# Patient Record
Sex: Female | Born: 1990 | Race: Black or African American | Hispanic: No | Marital: Single | Smoking: Never smoker
Health system: Southern US, Community
[De-identification: ages and names within clinical notes are randomized; demographics above are authoritative.]

---
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
## 2011-12-29 DIAGNOSIS — B3731 Acute candidiasis of vulva and vagina: Secondary | ICD-10-CM

## 2011-12-29 HISTORY — DX: Acute candidiasis of vulva and vagina: B37.31

## 2012-06-04 DIAGNOSIS — N390 Urinary tract infection, site not specified: Secondary | ICD-10-CM | POA: Insufficient documentation

## 2012-06-04 HISTORY — DX: Urinary tract infection, site not specified: N39.0

## 2012-06-06 DIAGNOSIS — N760 Acute vaginitis: Secondary | ICD-10-CM | POA: Insufficient documentation

## 2012-06-06 HISTORY — DX: Acute vaginitis: N76.0

## 2012-08-20 DIAGNOSIS — A5400 Gonococcal infection of lower genitourinary tract, unspecified: Secondary | ICD-10-CM | POA: Insufficient documentation

## 2012-08-20 DIAGNOSIS — A5901 Trichomonal vulvovaginitis: Secondary | ICD-10-CM | POA: Insufficient documentation

## 2012-08-20 DIAGNOSIS — N644 Mastodynia: Secondary | ICD-10-CM | POA: Insufficient documentation

## 2012-08-20 HISTORY — DX: Trichomonal vulvovaginitis: A59.01

## 2012-08-20 HISTORY — DX: Gonococcal infection of lower genitourinary tract, unspecified: A54.00

## 2012-08-20 HISTORY — DX: Mastodynia: N64.4

## 2016-01-14 ENCOUNTER — Emergency Department
Admission: EM | Admit: 2016-01-14 | Discharge: 2016-01-15 | Disposition: A | Discharge status: Home / Self Care | Payer: Self-pay | Attending: Emergency Medicine | Admitting: Emergency Medicine

## 2016-01-14 ENCOUNTER — Emergency Department: Payer: Self-pay

## 2016-01-14 DIAGNOSIS — N898 Other specified noninflammatory disorders of vagina: Secondary | ICD-10-CM | POA: Insufficient documentation

## 2016-01-14 LAB — URINE BHCG POC: Urine bHCG POC: NEGATIVE

## 2016-01-14 NOTE — ED Triage Notes (Signed)
Pt c/o intermittent lower abd pain x2 days. Also c/o green vaginal discharge, odor, and itching. Denies dysuria, hematuria, n/v/d, fever.

## 2016-01-14 NOTE — ED Provider Notes (Signed)
EMERGENCY DEPARTMENT HISTORY AND PHYSICAL EXAM     Physician/Midlevel provider first contact with patient: 01/14/16 2327         Date: 01/14/2016  Patient Name: Anita Huffman    History of Presenting Illness     Chief Complaint   Patient presents with   . Abdominal Pain   . Vaginal Discharge       History Provided By: Patient    Chief Complaint: Abdominal pain  Onset: 2 days ago  Timing: Constant  Location: Lower abdomen  Quality: Cramping pain  Severity: Moderate  Exacerbating factors: None  Alleviating factors: None  Associated Symptoms: Vaginal discharge    Additional History: Anita Huffman is a 25 y.o. female presenting to the ED with constant lower cramping abdominal pain starting 2 days ago. Pt states that she has not taken any medications for her symptoms. She also reports vaginal discharge starting earlier today. Pt's LNMP was 9/21, and she denies any history of pregnancy. Pt states that she is sexually active with men without birth control, but is not trying to get pregnant. She states that she has had chlamydia and gonorrhea in the past, and her current symptoms feel similar to her previous chlamydia and gonorrhea.     PCP: Pcp, Noneorunknown, MD  SPECIALISTS:    No current facility-administered medications for this encounter.      No current outpatient prescriptions on file.       Past History     Past Medical History:  History reviewed. No pertinent past medical history.    Past Surgical History:  History reviewed. No pertinent surgical history.    Family History:  No family history on file.    Social History:  Social History   Substance Use Topics   . Smoking status: Not on file   . Smokeless tobacco: Not on file   . Alcohol use Not on file       Allergies:  No Known Allergies    Review of Systems     Review of Systems   Constitutional: Negative for fever.   Gastrointestinal: Positive for abdominal pain ( mild lower abd pain/cramps). Negative for diarrhea, nausea and vomiting.   Genitourinary:  Negative for dysuria and flank pain.        Positive vaginal discharge   Musculoskeletal: Negative for back pain.   Neurological: Negative for loss of consciousness.   Psychiatric/Behavioral: Negative for suicidal ideas.       Physical Exam   BP 126/71   Pulse 74   Temp 97.5 F (36.4 C)   Resp 16   Ht 5\' 3"  (1.6 m)   Wt 54.4 kg   SpO2 100%   BMI 21.26 kg/m     CONSTITUTIONAL   Vital signs reviewed, Well appearing, Patient appears comfortable.  HEAD   Atraumatic. Normocephalic.  EYES   Eyes are normal to inspection, No discharge from eyes, Sclera are normal.  NECK   Normal ROM, No jugular venous distention, no tenderness, no meningeal signs.  CARDIOVASCULAR   RRR, No murmurs.  ABDOMEN   Abdomen is nontender, No distension, No peritoneal signs.  BACK   There is no CVA Tenderness, There is no tenderness to palpation.  NEURO   Alert and oriented, appropriate. No focal motor deficits. No focal sensory deficits. Speech normal.  SKIN   Skin is warm, Skin is dry, Skin Is normal color.  EXTREMITIES no c/c/e, no calf tenderness, negative homans  PELVIC Normal external genitalia. Copious green discharge.  No CMT, no adnexal tenderness.    Diagnostic Study Results     Labs -     Results     Procedure Component Value Units Date/Time    Wet Prep Trichomonas [[id number]] Collected:  01/14/16 2345    Specimen:  Vaginal Swab Updated:  01/15/16 0151    Narrative:       ORDER#: [id number]                                    ORDERED BY: Marlana Latus  SOURCE: Vaginal Swab                                 COLLECTED:  01/14/16 23:45  ANTIBIOTICS AT COLL.:                                RECEIVED :  01/15/16 01:44  Wet Prep Trichomonas                       FINAL       01/15/16 01:51   +  01/15/16   Positive for Yeast             No Trichomonas Seen             Reference Range: No Trichomonas or Yeast Seen      Chlamydia/GC PCR [[id number]] Collected:  01/14/16 2345    Specimen:  Vaginal Swab - Clinician Collected Updated:  01/15/16  0145    Narrative:       Call Lab first    UA, Reflex to Microscopic (pts 3 + yrs) [[id number]]  (Abnormal) Collected:  01/14/16 2345    Specimen:  Urine Updated:  01/15/16 0000     Urine Type Clean Catch     Color, UA Yellow     Clarity, UA Hazy     Specific Gravity UA 1.029     Urine pH 5.0     Leukocyte Esterase, UA Large (A)     Nitrite, UA Negative     Protein, UR 30 (A)     Glucose, UA Negative     Ketones UA Negative     Urobilinogen, UA Negative mg/dL      Bilirubin, UA Negative     Blood, UA Negative     RBC, UA 26 - 50 (A) /hpf      WBC, UA 26 - 50 (A) /hpf      Squamous Epithelial Cells, Urine 6 - 10 /hpf     Urine BHCG POC [[id number]] Collected:  01/14/16 2348     Updated:  01/14/16 2355     Urine bHCG POC Negative          Radiologic Studies -   Radiology Results (24 Hour)     ** No results found for the last 24 hours. **      .    Medical Decision Making   I am the first provider for this patient.    I reviewed the vital signs, available nursing notes, past medical history, past surgical history, family history and social history.    Vital Signs-Reviewed the patient's vital signs.     Patient Vitals for the past 12 hrs:   BP Temp Pulse Resp   01/15/16 0218 126/71 -  74 16   01/14/16 2323 137/82 97.5 F (36.4 C) 92 18       Pulse Oximetry Analysis - Normal 100% on RA    Old Medical Records:   Old medical records.  Nursing notes.     ED Course:   11:35 PM - Discussed plan for a pelvic exam. Pt understands and agrees. She declines pain medications at this time.     1:37 AM - Pelvic exam performed. RN Marylene Land present as chaperone. Pt was counseled on the importance of safe sex and of her partner being treated.     2:13 AM - Pt is feeling improved. Discussed lab work and pelvic exam results with pt and counseled on diagnosis, f/u plans (Ob/Gyn), and signs and symptoms when to return to ED.  Pt is stable and ready for discharge.       Provider Notes:   25 yo F with h/o gc/chlamydia and BV in the past, but  treated, p/w concern for lower abd cramping and foul smelling vaginal discharge.    Exam c/w gonorrhea given copious greenish [place], will treat with azithro/rocephin and for yeast  No signs of pid    Diagnosis     Clinical Impression:   1. Vaginal discharge        Treatment Plan:   ED Disposition     ED Disposition Condition Date/Time Comment    Discharge  Sat Jan 15, 2016  2:11 AM Anita Huffman discharge to home/self care.    Condition at disposition: Stable            _______________________________      Attestations: This note is prepared by Lessie Dings, acting as scribe for Marlana Latus, MD.    Marlana Latus, MD - The scribe's documentation has been prepared under my direction and personally reviewed by me in its entirety.  I confirm that the note above accurately reflects all work, treatment, procedures, and medical decision making performed by me.    _______________________________     Judi Cong, MD  01/15/16 [phone]

## 2016-01-15 LAB — URINALYSIS, REFLEX TO MICROSCOPIC EXAM IF INDICATED
Bilirubin, UA: NEGATIVE
Blood, UA: NEGATIVE
Glucose, UA: NEGATIVE
Ketones UA: NEGATIVE
Nitrite, UA: NEGATIVE
Protein, UR: 30 — AB
Specific Gravity UA: 1.029 (ref 1.001–1.035)
Urine pH: 5 (ref 5.0–8.0)
Urobilinogen, UA: NEGATIVE mg/dL

## 2016-01-15 MED ORDER — AZITHROMYCIN 250 MG PO TABS
1000.0000 mg | ORAL_TABLET | Freq: Once | ORAL | Status: AC
Start: 2016-01-15 — End: 2016-01-15
  Administered 2016-01-15: 1000 mg via ORAL
  Filled 2016-01-15: qty 4

## 2016-01-15 MED ORDER — CEFTRIAXONE SODIUM 250 MG IJ SOLR
250.0000 mg | Freq: Once | INTRAMUSCULAR | Status: AC
Start: 2016-01-15 — End: 2016-01-15
  Administered 2016-01-15: 250 mg via INTRAMUSCULAR
  Filled 2016-01-15: qty 250

## 2016-01-15 MED ORDER — FLUCONAZOLE 100 MG PO TABS
150.0000 mg | ORAL_TABLET | Freq: Once | ORAL | Status: AC
Start: 2016-01-15 — End: 2016-01-15
  Administered 2016-01-15: 150 mg via ORAL
  Filled 2016-01-15: qty 2

## 2016-01-15 NOTE — Discharge Instructions (Signed)
You have been treated for Gonorrhea and Chlamydia, as well as a yeast infection.     Please follow up with one of our OB/GYNs or with your primary doctor.     ____________________    Thank you for choosing [facility] for your emergency care needs.   We strive to provide EXCELLENT care to you and your family.     IF YOU DO NOT CONTINUE TO IMPROVE OR YOUR CONDITION WORSENS, PLEASE CONTACT YOUR DOCTOR OR RETURN IMMEDIATELY TO THE EMERGENCY DEPARTMENT.     DOCTOR REFERRALS   Call [phone] if you need any further referrals and we can help you find a primary care doctor or specialist. Also, available online at: [email or web address]     FREE HEALTH SERVICES   If you need help with health or social services, please call 2-1-1 for a free referral to resources in your area. 2-1-1 is a free service connecting people with information on health insurance, free clinics, pregnancy, mental health, dental care, food assistance, housing, and substance abuse counseling. Also, available online at: http://[email or web address].org     MEDICAL RECORDS AND TESTS   Certain laboratory test results do not come back the same day, for example urine cultures. We will contact you if other important findings are noted. Radiology films are often reviewed again to ensure accuracy. If there is any discrepancy, we will notify you.     ORTHOPEDIC INJURY   Please know that significant injuries can exist even when an initial x-ray is read as normal or negative. This can occur because some fractures (broken bones) are not initially visible on x-rays. For this reason, close outpatient follow-up with your primary care doctor or bone specialist (orthopedist) is required.     MEDICATIONS AND FOLLOWUP   Please be aware that some prescription medications can cause drowsiness. Use caution when driving or operating machinery.   The examination and treatment you have received in our Emergency Department is provided on an emergency  basis, and is not intended to be a substitute for your primary care physician. It is important that your doctor checks you again and that you report any new or remaining problems at that time.     ________________________        Vaginal Discharge    You have been diagnosed with a vaginal discharge.    Discharge (drainage) from the vagina often means there is an inflammation in the vagina. This is called "vaginitis." Some other vaginitis symptoms are: Itching, burning and odor. Urinating (peeing) may be uncomfortable.    The most common causes were checked during the evaluation today. However, other possible causes may need further evaluation by your family doctor or gynecologist.    Vaginal discharge and vaginitis have many causes. Some are:   Hormonal changes from menopause in older women.   Yeast Infections (Candida vaginitis): This happens when yeast normally living in the vagina grows out of control. The normal (good) bacteria living in the vagina can be killed or weakened. This allows the yeast to overgrow. This can happen if you used an antibiotic recently. Recent illness and long-term diabetes can weaken the immune system. This means your body may not be able to keep the yeast under control.   Nonspecific vaginitis: This results from abnormal bacterial overgrowth of the bacteria that usually live in the vagina. This may be caused by recent illness. It can also be caused by some medicines and douching. Gardnerella vaginitis (also called "Bacterial Vaginosis") is a common infection  that may result from overgrowth. Rarely, Gardnerella may be a sexually transmitted disease.   Sexually Transmitted Diseases: Some STDs cause a lot of frothy discharge that smells very bad and is green. Trichomoniasis ("trich") is one such STD. Chlamydia can also cause discharge. However, it may have little or no symptoms of which you are aware. Chlamydia may cause a severe (serious) disease called Pelvic Inflammatory Disease  (PID). It may also cause the reproductive organs to scar, which causes chronic (ongoing) pelvic pain and infertility (the inability to have babies). Gonorrhea "GC" may cause heavy purulent (pus-like) discharge. It can also cause PID. Most STDs pass easily between sexual partners. You and your sexual partner(s) would need to be treated.   Viral vaginitis: Herpes Simplex is often the cause. This causes a thin, watery discharge. It is linked with painful blisters on the vagina and swelling of the vaginal lips. Urination and sexual intercourse (sex) may be painful. It is a very infectious STD. Symptoms can be treated but there is no cure.   Allergic vaginitis: The vagina gets irritated from coming into contact with certain substances. These include latex condoms, spermicidal jellies, soaps and bubble baths. It also includes vaginal douche products.    If it is confirmed or suspected that you have an STD, tell your sexual partner(s). Then your partner can be checked by a doctor or the local health department.    To protect yourself and your sexual partner from STDs, use a condom during intercourse (sex), especially if your diagnosis is uncertain and you are waiting for the results of pelvic cultures done during this visit.    The health care provider who saw you feels it is OK to send you home.    You may need to return here or go to the nearest Emergency Department if more symptoms develop. This might mean that you are having complications like worsening infection or bleeding. There could also be some other undiagnosed problem.    It is important to have a follow-up. See your gynecologist or family doctor in the next few days. The medical staff may have told your doctor about this visit. If not, be sure to do so yourself.   If you don t have the right follow-up care available, tell members of the medical staff before you go. They can help make arrangements for you.    YOU SHOULD SEEK MEDICAL ATTENTION  IMMEDIATELY, EITHER HERE OR AT THE NEAREST EMERGENCY DEPARTMENT, IF ANY OF THE FOLLOWING OCCURS:   Increasing pain in the abdomen (belly), pelvis or back.   Large amounts of vaginal discharge or bleeding, passing large blood clots.   Fever (temperature higher than 100.55F / 38C), chills, nausea, vomiting (throwing up).   You are dizzy, lightheaded, or pass out.

## 2016-05-04 ENCOUNTER — Emergency Department
Admission: EM | Admit: 2016-05-04 | Discharge: 2016-05-05 | Disposition: A | Discharge status: Home / Self Care | Payer: Self-pay | Attending: Emergency Medicine | Admitting: Emergency Medicine

## 2016-05-04 ENCOUNTER — Emergency Department: Payer: Self-pay

## 2016-05-04 DIAGNOSIS — B9689 Other specified bacterial agents as the cause of diseases classified elsewhere: Secondary | ICD-10-CM | POA: Insufficient documentation

## 2016-05-04 DIAGNOSIS — R102 Pelvic and perineal pain: Secondary | ICD-10-CM

## 2016-05-04 DIAGNOSIS — N760 Acute vaginitis: Secondary | ICD-10-CM | POA: Insufficient documentation

## 2016-05-04 MED ORDER — METRONIDAZOLE 250 MG PO TABS
500.0000 mg | ORAL_TABLET | Freq: Once | ORAL | Status: AC
Start: 2016-05-04 — End: 2016-05-05
  Administered 2016-05-05: 500 mg via ORAL
  Filled 2016-05-04: qty 2

## 2016-05-04 MED ORDER — AZITHROMYCIN 250 MG PO TABS
1000.0000 mg | ORAL_TABLET | Freq: Once | ORAL | Status: AC
Start: 2016-05-04 — End: 2016-05-05
  Administered 2016-05-05: 1000 mg via ORAL
  Filled 2016-05-04: qty 4

## 2016-05-04 MED ORDER — CEFTRIAXONE SODIUM 250 MG IJ SOLR
250.0000 mg | Freq: Once | INTRAMUSCULAR | Status: AC
Start: 2016-05-04 — End: 2016-05-05
  Administered 2016-05-05: 250 mg via INTRAMUSCULAR
  Filled 2016-05-04: qty 250

## 2016-05-04 MED ORDER — METRONIDAZOLE 500 MG PO TABS
500.0000 mg | ORAL_TABLET | Freq: Two times a day (BID) | ORAL | 0 refills | Status: AC
Start: 2016-05-04 — End: 2016-05-11

## 2016-05-04 NOTE — ED Provider Notes (Signed)
EMERGENCY DEPARTMENT HISTORY AND PHYSICAL EXAM     Physician/Midlevel provider first contact with patient: 05/04/16 2322         Date: 05/04/2016  Patient Name: Anita Huffman  Attending Physician:  Ames Dura, DO, FACOEP      History of Presenting Illness     Chief Complaint   Patient presents with   . Abdominal Pain       History Provided By: Pt  Chief Complaint: Abdominal pain  Onset: 3 days ago  Timing: Intermittent (for minutes)  Location: Lower  Quality: Aching  Severity: Moderate  Modifying Factors: No change w/ eating.  Associated sxs: White and green vaginal discharge and itching and burning vaginal pain. Denies dysuria, CP, cough, SOB, fever, or difficulty breathing.    Additional History: Anita Huffman is a 26 y.o. female p/w intermittent (for minutes) aching lower abdominal pain starting 3 days ago w/ associated white and green vaginal discharge and itching and burning vaginal pain. The pt states the pain does not change w/ eating. Denies h/o this abdominal pain. She says her LNMP was at the end of December, she has had unprotected sex with her partner, and is unsure if she is pregnant. She states she has a h/o STDs, this feels like an STD, and denies her partner having symptoms. She says she has 2 kids. Denies smoking or drinking alcohol. Denies dysuria, CP, cough, SOB, fever, or difficulty breathing.    PCP: Pcp, Noneorunknown, MD      No current facility-administered medications for this encounter.      Current Outpatient Prescriptions   Medication Sig Dispense Refill   . metroNIDAZOLE (FLAGYL) 500 MG tablet Take 1 tablet (500 mg total) by mouth 2 (two) times daily.for 7 days 14 tablet 0       Past Medical History   Past Medical History:  History reviewed. No pertinent past medical history.    Past Surgical History:  History reviewed. No pertinent surgical history.    Family History:  History reviewed. No pertinent family history.    Social History:  Social History   Substance Use Topics    . Smoking status: Never Smoker   . Smokeless tobacco: Never Used   . Alcohol use Yes      Comment: social       Allergies:  Allergies   Allergen Reactions   . Robitussin [Guaifenesin]        Review of Systems     Review of Systems   Constitutional: Negative for fever.   Respiratory: Negative for cough and shortness of breath.         (-) Difficulty breathing   Cardiovascular: Negative for chest pain.   Gastrointestinal: Positive for abdominal pain.   Genitourinary: Positive for vaginal discharge and vaginal pain. Negative for dysuria.       Physical Exam     BP 128/72   Pulse (!) 108   Temp 98.4 F (36.9 C) (Oral)   Resp 16   Ht 5\' 3"  (1.6 m)   Wt 56.7 kg   SpO2 100%   BMI 22.14 kg/m   Pulse Oximetry Analysis - Normal 100% on RA  Physical Exam   Constitutional: She appears well-developed and well-nourished. No distress.   HENT:   Head: Normocephalic and atraumatic.   Neck: Normal range of motion. Neck supple.   Cardiovascular: Regular rhythm.  Tachycardia present.    Pulmonary/Chest: Effort normal. No respiratory distress.   Abdominal: Soft. She exhibits no distension.  There is no tenderness.   Genitourinary:   Genitourinary Comments: Deferred    Musculoskeletal: Normal range of motion.   Neurological: She is alert. She exhibits normal muscle tone.   Skin: Skin is warm and dry. She is not diaphoretic.   Psychiatric: She has a normal mood and affect. Her behavior is normal.   Vitals reviewed.        Diagnostic Study Results     Labs -     Results     Procedure Component Value Units Date/Time    UA, Reflex to Microscopic (pts 3 + yrs) [[id number]]  (Abnormal) Collected:  05/05/16 0001    Specimen:  Urine Updated:  05/05/16 0029     Urine Type Clean Catch     Color, UA Yellow     Clarity, UA Hazy     Specific Gravity UA 1.031     Urine pH 5.0     Leukocyte Esterase, UA Large (A)     Nitrite, UA Negative     Protein, UR 30 (A)     Glucose, UA Negative     Ketones UA Trace (A)     Urobilinogen, UA 2.0 mg/dL       Bilirubin, UA Negative     Blood, UA Negative     RBC, UA 6 - 10 (A) /hpf      WBC, UA 6 - 10 (A) /hpf      Squamous Epithelial Cells, Urine 0 - 5 /hpf      Urine Mucus Present    Urine BHCG POC [[id number]] Collected:  05/05/16 0005     Updated:  05/05/16 0010     Urine bHCG POC Negative          Radiologic Studies -   Radiology Results (24 Hour)     ** No results found for the last 24 hours. **      .    Medical Decision Making   I am the first provider for this patient.    I reviewed the vital signs, available nursing notes, past medical history, past surgical history, family history and social history.    Vital Signs-Reviewed the patient's vital signs.     Patient Vitals for the past 12 hrs:   BP Temp Pulse Resp   05/04/16 2312 128/72 98.4 F (36.9 C) (!) 108 16         Doctor's Notes     Medical Decision Making:   Symptoms are consistent with vaginosis/STD and will treat as such. Pt deferred vaginal exam/cultures and prefers empirical treatment instead. Will f/u with gyn as outpatient.       Diagnosis and Treatment Plan       Clinical Impression:   1. Bacterial vaginosis    2. Pelvic pain        Treatment Plan:   ED Disposition     ED Disposition Condition Date/Time Comment    Discharge  Fri May 05, 2016 12:35 AM Anita Huffman discharge to home/self care.    Condition at disposition: Stable            _______________________________    Attestations:  This note is prepared by Johnnette Barrios, acting as scribe for Ames Dura, DO, [place]. The scribe's documentation has been prepared under my direction and personally reviewed by me in its entirety.  I confirm that the note above accurately reflects all work, treatment, procedures, and medical decision making performed by me.     I am the  first provider for this patient.      Ames Dura, DO, FACOEP is the primary emergency doctor of record.    _______________________________           Ames Dura, DO  05/05/16 0040

## 2016-05-04 NOTE — ED Notes (Signed)
Pt confided in [facility] that she is having vaginal discharge and hasn't had her period since December.

## 2016-05-04 NOTE — ED Triage Notes (Signed)
Pt to ED with lower abd pain x 4 days with nausea. Pt reports no diarrhea, constipation or urinary symptoms.

## 2016-05-05 DIAGNOSIS — B9689 Other specified bacterial agents as the cause of diseases classified elsewhere: Secondary | ICD-10-CM

## 2016-05-05 DIAGNOSIS — N760 Acute vaginitis: Secondary | ICD-10-CM

## 2016-05-05 LAB — URINALYSIS, REFLEX TO MICROSCOPIC EXAM IF INDICATED
Bilirubin, UA: NEGATIVE
Blood, UA: NEGATIVE
Glucose, UA: NEGATIVE
Nitrite, UA: NEGATIVE
Protein, UR: 30 — AB
Specific Gravity UA: 1.031 (ref 1.001–1.035)
Urine pH: 5 (ref 5.0–8.0)
Urobilinogen, UA: 2 mg/dL

## 2016-05-05 LAB — URINE BHCG POC: Urine bHCG POC: NEGATIVE

## 2016-05-05 NOTE — Discharge Instructions (Signed)
Vaginitis    You have been diagnosed with vaginitis.    This is an infection caused by bacteria. Some symptoms are vaginal itching or pain, painful urination (peeing) and abnormal vaginal discharge (drainage) that sometimes has a bad smell. The diagnosis is based on symptoms and a physical exam. It is also based on examining the discharge under a microscope.    Vaginitis is treated with medicine for the specific type of infection. No other special treatment or follow-up is needed unless symptoms do not get better or your condition gets worse.    YOU SHOULD SEEK MEDICAL ATTENTION IMMEDIATELY, EITHER HERE OR AT THE NEAREST EMERGENCY DEPARTMENT, IF ANY OF THE FOLLOWING OCCURS:   Pain in the pelvis or lower abdomen (belly).   Fever (temperature higher than 100.4F / 38C) or chills.   Discharge gets worse or vagina severely (seriously) irritated.             Pelvic Pain    You have been diagnosed with pelvic pain.    Pelvic pain affects many women who come here for evaluation. Your doctor has checked for the most dangerous causes of pelvic pain like appendicitis, pelvic abscess ectopic (tubal) pregnancy or other pregnancy complications. You do not have any of these problems.    Pelvic pain has many other causes that cannot be found by the type of testing that can be done here today. Some diseases may cause abnormal vaginal bleeding with pain. This includes uterine fibroids and endometriosis. For these problems, doctors often need to look at the pelvic organs. This is done in the operating room under general anesthesia (drugs to make you unconscious).    The doctor may suspect a specific cause of your pelvic pain after examining you today and doing some tests. Such a cause could be infection of the uterus or fallopian tubes (called "pelvic inflammatory disease" or "PID"). If so, treatment can start right away. It doesn't matter that it might take a few days to get all test results back.    Cancer rarely  causes serious pelvic pain. However, it still needs to be considered as a possible cause. The emergency department or urgent care clinic often cannot diagnose or rule out cancer as the cause of pelvic pain. Cancer screening is not part of a routine emergency evaluation.    It is VERY IMPORTANT to follow up with your regular doctor. This will be to evaluate all possible causes of the pain.    It is OK to go home now.    You may need to return here or go to the nearest Emergency Department if symptoms that might signal complications develop. These include: Worsening infection, vaginal bleeding or some other problem.    Follow up with your gynecologist or regular doctor in the next few days. Tell your doctor about this visit.   If you do not have a regular doctor, tell the medical staff before leaving. That way, we can help make arrangements for you.    YOU SHOULD SEEK MEDICAL ATTENTION IMMEDIATELY, EITHER HERE OR AT THE NEAREST EMERGENCY DEPARTMENT, IF ANY OF THE FOLLOWING OCCURS:   You have worse pain in the abdomen (belly), pelvis or back.   More and more vaginal discharge or bleeding, soaking of pads/tampons (more than one pad per hour), passing large clots.   Fever (temperature higher than 100.4F / 38C), chills, nausea, vomiting (throwing up).   Feeling dizzy, lightheaded or passing out.

## 2016-12-03 ENCOUNTER — Emergency Department
Admission: EM | Admit: 2016-12-03 | Discharge: 2016-12-03 | Disposition: A | Discharge status: Home / Self Care | Payer: Self-pay | Attending: Emergency Medicine | Admitting: Emergency Medicine

## 2016-12-03 DIAGNOSIS — F17200 Nicotine dependence, unspecified, uncomplicated: Secondary | ICD-10-CM | POA: Insufficient documentation

## 2016-12-03 DIAGNOSIS — N760 Acute vaginitis: Secondary | ICD-10-CM | POA: Insufficient documentation

## 2016-12-03 DIAGNOSIS — Z8742 Personal history of other diseases of the female genital tract: Secondary | ICD-10-CM | POA: Insufficient documentation

## 2016-12-03 LAB — URINALYSIS, REFLEX TO MICROSCOPIC EXAM IF INDICATED
Bilirubin, UA: NEGATIVE
Blood, UA: NEGATIVE
Glucose, UA: NEGATIVE
Nitrite, UA: NEGATIVE
Protein, UR: NEGATIVE
Specific Gravity UA: 1.021 (ref 1.001–1.035)
Urine pH: 5 (ref 5.0–8.0)
Urobilinogen, UA: 2 mg/dL

## 2016-12-03 LAB — URINE BHCG POC: Urine bHCG POC: NEGATIVE

## 2016-12-03 MED ORDER — FLUCONAZOLE 100 MG PO TABS
150.0000 mg | ORAL_TABLET | ORAL | Status: DC
Start: 2016-12-03 — End: 2016-12-03

## 2016-12-03 MED ORDER — METRONIDAZOLE 500 MG PO TABS
500.0000 mg | ORAL_TABLET | Freq: Two times a day (BID) | ORAL | 0 refills | Status: AC
Start: 2016-12-03 — End: 2016-12-10

## 2016-12-03 MED ORDER — AZITHROMYCIN 250 MG PO TABS
1000.0000 mg | ORAL_TABLET | Freq: Once | ORAL | Status: AC
Start: 2016-12-03 — End: 2016-12-03
  Administered 2016-12-03: 1000 mg via ORAL
  Filled 2016-12-03: qty 4

## 2016-12-03 MED ORDER — FLUCONAZOLE 100 MG PO TABS
150.0000 mg | ORAL_TABLET | Freq: Once | ORAL | Status: AC
Start: 2016-12-03 — End: 2016-12-03
  Administered 2016-12-03: 150 mg via ORAL
  Filled 2016-12-03: qty 2

## 2016-12-03 MED ORDER — CEFTRIAXONE SODIUM 250 MG IJ SOLR
250.0000 mg | Freq: Once | INTRAMUSCULAR | Status: AC
Start: 2016-12-03 — End: 2016-12-03
  Administered 2016-12-03: 250 mg via INTRAMUSCULAR
  Filled 2016-12-03: qty 250

## 2016-12-03 NOTE — ED Provider Notes (Signed)
EMERGENCY DEPARTMENT HISTORY AND PHYSICAL EXAM     Physician/Midlevel provider first contact with patient: 12/03/16 1746         Date: 12/03/2016  Patient Name: Anita Huffman    History of Presenting Illness     Chief Complaint   Patient presents with   . Abdominal Pain       History Provided By: Patient    Chief Complaint: abd pain  Duration: 1 week  Timing:  Worsening  Location: lower  Severity: Moderate  Exacerbating factors: hx of BV  Alleviating factors: none  Associated Symptoms: urgency and vaginal discharge  Pertinent Negatives: No dysuria, itching, rash, fever, or v/d    Additional History: Anita Huffman is a 26 y.o. female G2P2 with hx of endometriosis presenting to the ED with c/o worsening lower abd pain 1 week ago with associated urgency and white vaginal discharge. Denies dysuria, itching, rash, fever, or v/d. Pt had BV earlier this year and was tested for Gonorrhea and Chlamydia at that time. Pt is currently sexually active but doesn't use protection. She last had unprotected sex a couple of days ago.        PCP: Pcp, Noneorunknown, MD  SPECIALISTS:    Current Facility-Administered Medications   Medication Dose Route Frequency Provider Last Rate Last Dose   . azithromycin (ZITHROMAX) tablet 1,000 mg  1,000 mg Oral Once Sharyne Richters, MD       . cefTRIAXone (ROCEPHIN) injection 250 mg  250 mg Intramuscular Once Sharyne Richters, MD       . fluconazole (DIFLUCAN) tablet 150 mg  150 mg Oral Once Sharyne Richters, MD         Current Outpatient Prescriptions   Medication Sig Dispense Refill   . metroNIDAZOLE (FLAGYL) 500 MG tablet Take 1 tablet (500 mg total) by mouth 2 (two) times daily.for 7 days 14 tablet 0       Past History     Past Medical History:  History reviewed. No pertinent past medical history.    Past Surgical History:  History reviewed. No pertinent surgical history.    Family History:  History reviewed. No pertinent family history.    Social History:  Social  History   Substance Use Topics   . Smoking status: Light Tobacco Smoker   . Smokeless tobacco: Never Used   . Alcohol use Yes      Comment: social       Allergies:  Allergies   Allergen Reactions   . Robitussin [Guaifenesin]        Review of Systems     Review of Systems   Constitutional: Negative for fever.   Gastrointestinal: Positive for abdominal pain. Negative for diarrhea and vomiting.   Genitourinary: Positive for urgency and vaginal discharge. Negative for dysuria.   Skin: Negative for rash.   All other systems reviewed and are negative.        Physical Exam   BP 137/87   Pulse 88   Temp 98.2 F (36.8 C) (Oral)   Resp 14   Ht 5\' 3"  (1.6 m)   Wt 53.5 kg   LMP 11/27/2016   SpO2 99%   BMI 20.90 kg/m     Physical Exam   Constitutional: Patient is alert.  Well nourished.  NAD  Head: Atraumatic.   Eyes: EOMI. PERRL  ENT:  MMM.   Cardiovascular: Normal rate and regular rhythm.   Pulmonary/Chest: Effort normal and breath sounds normal. No respiratory distress.  Abdominal: Soft. There is no tenderness. Bowel sounds present and normal.    Pelvic:  Thin whitish discharge.  No lesions.   Musculoskeletal:  No lower extremity edema or tenderness.    Neurological: Patient is alert and oriented to person, place, and time.  No focal deficits.   Skin: Skin is warm and dry.    Diagnostic Study Results     Labs -     Results     Procedure Component Value Units Date/Time    Wet Prep Trichomonas [[id number]] Collected:  12/03/16 1816    Specimen:  Cervical Swab Updated:  12/03/16 1833    Narrative:       ORDER#: [id number]                                    ORDERED BY: Ulice Bold  SOURCE: Cervical Swab                                COLLECTED:  12/03/16 18:16  ANTIBIOTICS AT COLL.:                                RECEIVED :  12/03/16 18:20  Wet Prep Trichomonas                       FINAL       12/03/16 18:33  12/03/16   No Trichomonas or Yeast Seen             Reference Range: No Trichomonas or Yeast Seen      UA,  Reflex to Microscopic [[id number]]  (Abnormal) Collected:  12/03/16 1816    Specimen:  Urine Updated:  12/03/16 1829     Urine Type Clean Catch     Color, UA Yellow     Clarity, UA Hazy     Specific Gravity UA 1.021     Urine pH 5.0     Leukocyte Esterase, UA Small (A)     Nitrite, UA Negative     Protein, UR Negative     Glucose, UA Negative     Ketones UA Trace (A)     Urobilinogen, UA 2.0 mg/dL      Bilirubin, UA Negative     Blood, UA Negative     RBC, UA 6 - 10 (A) /hpf      WBC, UA 0 - 5 /hpf      Squamous Epithelial Cells, Urine 0 - 5 /hpf      Yeast, UA Rare (A)     Urine Mucus Present    Chlamydia/GC by PCR [[id number]] Collected:  12/03/16 1817    Specimen:  Vaginal Swab - Clinician Collected Updated:  12/03/16 1817    Narrative:       Call Lab first    Urine BHCG POC [[id number]] Collected:  12/03/16 1805     Updated:  12/03/16 1813     Urine bHCG POC Negative          Radiologic Studies -   Radiology Results (24 Hour)     ** No results found for the last 24 hours. **      .    Medical Decision Making   I am the first provider for this patient.    I reviewed the  vital signs, available nursing notes, past medical history, past surgical history, family history and social history.    Vital Signs-Reviewed the patient's vital signs.     Patient Vitals for the past 12 hrs:   BP Temp Pulse Resp   12/03/16 1728 137/87 98.2 F (36.8 C) 88 14       Pulse Oximetry Analysis - Normal 99% on RA    Old Medical Records: Old medical records.     ED Course:     6:14 PM - Counseled on using protection when having sexual intercourse. Performed pelvic exam with RN as chaperone.     6:45 PM - Updated pt on UA and Wet Prep results. Pt was counseled on diagnosis, f/u plans with Gyn, medication use, and return precautions. Pt is stable and ready for discharge.     Provider Notes: Vaginal discharge.  Wet prep neg.  UA w/yeast.  +Risk factors and Hx of STIs.      Diagnosis     Clinical Impression:   1. Acute vaginitis         Treatment Plan:   ED Disposition     ED Disposition Condition Date/Time Comment    Discharge  Sun Dec 03, 2016  6:46 PM Anita Huffman discharge to home/self care.    Condition at disposition: Stable            _______________________________      Attestations: This note is prepared by Ralph Leyden, acting as scribe for Ulice Bold, MD.    Ulice Bold, MD - The scribe's documentation has been prepared under my direction and personally reviewed by me in its entirety.  I confirm that the note above accurately reflects all work, treatment, procedures, and medical decision making performed by me.    _______________________________     Sharyne Richters, MD  12/03/16 [phone]

## 2016-12-03 NOTE — ED Triage Notes (Signed)
26 yo female c/o lower abdominal pain since 3 pm today.

## 2016-12-03 NOTE — Discharge Instructions (Signed)
Thank you for choosing [facility] for your emergency care needs.   We strive to provide EXCELLENT care to you and your family.      If you do not continue to improve or your condition worsens, please contact your doctor or return immediately to the Emergency Department.    DOCTOR REFERRALS  Call [phone] if you need any further referrals and we can help you find a primary care doctor or specialist.  Also, available online at:  [email or web address]      FREE HEALTH SERVICES  If you need help with health or social services, please call 2-1-1 for a free referral to resources in your area.  2-1-1 is a free service connecting people with information on health insurance, free clinics, pregnancy, mental health, dental care, food assistance, housing, and substance abuse counseling.  Also, available online at:  http://[email or web address].org    MEDICAL RECORDS AND TESTS  Certain laboratory test results do not come back the same day, for example urine cultures.   We will contact you if other important findings are noted.  Radiology films are often reviewed again to ensure accuracy.  If there is any discrepancy, we will notify you.      ORTHOPEDIC INJURY   Please know that significant injuries can exist even when an initial x-ray is read as normal or negative.  This can occur because some fractures (broken bones) are not initially visible on x-rays.  For this reason, close outpatient follow-up with your primary care doctor or bone specialist (orthopedist) is required.    MEDICATIONS AND FOLLOWUP  Please be aware that some prescription medications can cause drowsiness.  Use caution when driving or operating machinery.    BLOOD PRESSURE  If at any time during your visit, the measured blood pressure was greater than 120 systolic (top number) or 80 diastolic (bottom number), please see you primary care physician in 1-2 days.     The examination and treatment you have received in our Emergency  Department is provided on an emergency basis, and is not intended to be a substitute for your primary care physician.  It is important that your doctor checks you again and that you report any new or remaining problems at that time.        Vaginitis    You have been diagnosed with vaginitis.    This is an infection caused by bacteria. Some symptoms are vaginal itching or pain, painful urination (peeing) and abnormal vaginal discharge (drainage) that sometimes has a bad smell. The diagnosis is based on symptoms and a physical exam. It is also based on examining the discharge under a microscope.    Vaginitis is treated with medicine for the specific type of infection. No other special treatment or follow-up is needed unless symptoms do not get better or your condition gets worse.    YOU SHOULD SEEK MEDICAL ATTENTION IMMEDIATELY, EITHER HERE OR AT THE NEAREST EMERGENCY DEPARTMENT, IF ANY OF THE FOLLOWING OCCURS:   Pain in the pelvis or lower abdomen (belly).   Fever (temperature higher than 100.34F / 38C) or chills.   Discharge gets worse or vagina severely (seriously) irritated.

## 2016-12-06 LAB — CHLAMYDIA/NEISSERIA BY PCR
Chlamydia DNA by PCR: POSITIVE — AB
Neisseria gonorrhoeae by PCR: NEGATIVE

## 2017-03-18 ENCOUNTER — Emergency Department
Admission: EM | Admit: 2017-03-18 | Discharge: 2017-03-18 | Disposition: A | Discharge status: Home / Self Care | Payer: Self-pay | Attending: Emergency Medicine | Admitting: Emergency Medicine

## 2017-03-18 DIAGNOSIS — F17200 Nicotine dependence, unspecified, uncomplicated: Secondary | ICD-10-CM | POA: Insufficient documentation

## 2017-03-18 DIAGNOSIS — N898 Other specified noninflammatory disorders of vagina: Secondary | ICD-10-CM

## 2017-03-18 DIAGNOSIS — N760 Acute vaginitis: Secondary | ICD-10-CM | POA: Insufficient documentation

## 2017-03-18 LAB — URINALYSIS, REFLEX TO MICROSCOPIC EXAM IF INDICATED
Bilirubin, UA: NEGATIVE
Blood, UA: NEGATIVE
Glucose, UA: NEGATIVE
Ketones UA: NEGATIVE
Leukocyte Esterase, UA: NEGATIVE
Nitrite, UA: NEGATIVE
Protein, UR: NEGATIVE
Specific Gravity UA: >1.03 (ref 1.001–1.035)
Urine pH: 6 (ref 5.0–8.0)
Urobilinogen, UA: 0.2 mg/dL

## 2017-03-18 LAB — GROUP A STREP, RAPID ANTIGEN: Group A Strep, Rapid Antigen: NEGATIVE

## 2017-03-18 LAB — URINE HCG QUALITATIVE: Urine HCG Qualitative: NEGATIVE

## 2017-03-18 MED ORDER — CEFTRIAXONE SODIUM 250 MG IJ SOLR
INTRAMUSCULAR | Status: AC
Start: 2017-03-18 — End: 2017-03-18
  Administered 2017-03-18: 05:00:00 250 mg via INTRAMUSCULAR
  Filled 2017-03-18: qty 250

## 2017-03-18 MED ORDER — AZITHROMYCIN 250 MG PO TABS
1000.00 mg | ORAL_TABLET | Freq: Once | ORAL | Status: AC
Start: 2017-03-18 — End: 2017-03-18
  Administered 2017-03-18: 05:00:00 1000 mg via ORAL
  Filled 2017-03-18: qty 4

## 2017-03-18 MED ORDER — CEFTRIAXONE SODIUM 250 MG IJ SOLR
125.00 mg | Freq: Once | INTRAMUSCULAR | Status: DC
Start: 2017-03-18 — End: 2017-03-18
  Filled 2017-03-18: qty 250

## 2017-03-18 MED ORDER — CEFTRIAXONE SODIUM 250 MG IJ SOLR
250.00 mg | Freq: Once | INTRAMUSCULAR | Status: AC
Start: 2017-03-18 — End: 2017-03-18

## 2017-03-18 NOTE — ED Notes (Signed)
Pt d/c to home. Stable, ambulates with steady gait. All belongings with pt.     Pt verbalized understanding. Pt verbalized d/c instructions and f/u instructions.    No reactions noted from injection.

## 2017-03-18 NOTE — Discharge Instructions (Signed)
Vaginal Discharge     You have been diagnosed with a vaginal discharge.     Discharge (drainage) from the vagina often means there is an inflammation in the vagina. This is called “vaginitis.” Some other vaginitis symptoms are: Itching, burning and odor. Urinating (peeing) may be uncomfortable.     The most common causes were checked during the evaluation today. However, other possible causes may need further evaluation by your family doctor or gynecologist.     Vaginal discharge and vaginitis have many causes. Some are:  · Hormonal changes from menopause in older women.  · Yeast Infections (Candida vaginitis): This happens when yeast normally living in the vagina grows out of control. The normal (good) bacteria living in the vagina can be killed or weakened. This allows the yeast to overgrow. This can happen if you used an antibiotic recently. Recent illness and long-term diabetes can weaken the immune system. This means your body may not be able to keep the yeast under control.  · Nonspecific vaginitis: This results from abnormal bacterial overgrowth of the bacteria that usually live in the vagina. This may be caused by recent illness. It can also be caused by some medicines and douching. Gardnerella vaginitis (also called "Bacterial Vaginosis") is a common infection that may result from overgrowth. Rarely, Gardnerella may be a sexually transmitted disease.  · Sexually Transmitted Diseases: Some STDs cause a lot of frothy discharge that smells very bad and is green. Trichomoniasis ("trich") is one such STD. Chlamydia can also cause discharge. However, it may have little or no symptoms of which you are aware. Chlamydia may cause a severe (serious) disease called Pelvic Inflammatory Disease (PID). It may also cause the reproductive organs to scar, which causes chronic (ongoing) pelvic pain and infertility (the inability to have babies). Gonorrhea "GC" may cause heavy purulent (pus-like) discharge. It can also cause  PID. Most STDs pass easily between sexual partners. You and your sexual partner(s) would need to be treated.  · Viral vaginitis: Herpes Simplex is often the cause. This causes a thin, watery discharge. It is linked with painful blisters on the vagina and swelling of the vaginal lips. Urination and sexual intercourse (sex) may be painful. It is a very infectious STD. Symptoms can be treated but there is no cure.  · Allergic vaginitis: The vagina gets irritated from coming into contact with certain substances. These include latex condoms, spermicidal jellies, soaps and bubble baths. It also includes vaginal douche products.     If it is confirmed or suspected that you have an STD, tell your sexual partner(s). Then your partner can be checked by a doctor or the local health department.     To protect yourself and your sexual partner from STDs, use a condom during intercourse (sex), especially if your diagnosis is uncertain and you are waiting for the results of pelvic cultures done during this visit.     The health care provider who saw you feels it is OK to send you home.     You may need to return here or go to the nearest Emergency Department if more symptoms develop. This might mean that you are having complications like worsening infection or bleeding. There could also be some other undiagnosed problem.     It is important to have a follow-up. See your gynecologist or family doctor in the next few days. The medical staff may have told your doctor about this visit. If not, be sure to do so yourself.  · If you don't have the right follow-up care available,   tell members of the medical staff before you go. They can help make arrangements for you.     YOU SHOULD SEEK MEDICAL ATTENTION IMMEDIATELY, EITHER HERE OR AT THE NEAREST EMERGENCY DEPARTMENT, IF ANY OF THE FOLLOWING OCCURS:  · Increasing pain in the abdomen (belly), pelvis or back.  · Large amounts of vaginal discharge or bleeding, passing large blood  clots.  · Fever (temperature higher than 100.4ºF / 38ºC), chills, nausea, vomiting (throwing up).  · You are dizzy, lightheaded, or pass out.

## 2017-03-18 NOTE — ED Notes (Signed)
Pt requesting for work note, Dr Roxan Hockey notified.

## 2017-03-18 NOTE — ED Provider Notes (Signed)
EMERGENCY DEPARTMENT HISTORY AND PHYSICAL EXAM     Physician/Midlevel provider first contact with patient: 03/18/17 0342         Date: 03/18/2017  Patient Name: Anita Huffman    History of Presenting Illness     Chief Complaint   Patient presents with   . Abdominal Pain       History Provided By: Patient    Chief Complaint: Lower abdominal pain  Duration: 1 day PTA  Timing:  Gradual  Location: suprapubic  Quality: pressure  Severity: Moderate  Exacerbating factors: none  Alleviating factors: none tried  Associated Symptoms: vaginal discharge  Pertinent Negatives: no n/v/d/fever    Additional History: Anita Huffman is a 26 y.o. female presenting to the ED with lower abdominal pain and vaginal discharge since yesterday.  H/o similar sxs in the past which have been related to vaginitis.  Pt is sexually active with one partner and uses no protection.  Denies n/v/d or fever.  LMP was 02/25/17      PCP: Pcp, Noneorunknown, MD  SPECIALISTS:    No current facility-administered medications for this encounter.      No current outpatient prescriptions on file.       Past History     Past Medical History:  History reviewed. No pertinent past medical history.    Past Surgical History:  History reviewed. No pertinent surgical history.    Family History:  History reviewed. No pertinent family history.    Social History:  Social History   Substance Use Topics   . Smoking status: Light Tobacco Smoker   . Smokeless tobacco: Never Used   . Alcohol use Yes      Comment: socially       Allergies:  Allergies   Allergen Reactions   . Robitussin [Guaifenesin]        Review of Systems     Review of Systems   Constitutional: Negative for fever.   HENT: Negative for trouble swallowing.    Eyes: Negative for redness.   Respiratory: Negative for shortness of breath.    Cardiovascular: Negative for chest pain.   Gastrointestinal: Positive for abdominal pain. Negative for diarrhea, nausea and vomiting.   Genitourinary: Positive for pelvic  pain and vaginal discharge. Negative for difficulty urinating, dysuria and flank pain.   Musculoskeletal: Negative for back pain.   Skin: Negative for rash.   Allergic/Immunologic:        Allergy to robitussin   Neurological: Negative for light-headedness.   Psychiatric/Behavioral: Negative for suicidal ideas.         Physical Exam   BP 115/68   Pulse 89   Temp 98 F (36.7 C) (Oral)   Resp 16   Ht 5\' 3"  (1.6 m)   Wt 56.7 kg   LMP 02/24/2017   SpO2 97%   BMI 22.14 kg/m     Physical Exam   Constitutional: She is oriented to person, place, and time. She appears well-developed and well-nourished.   HENT:   Head: Normocephalic.   Mouth/Throat: Oropharyngeal exudate (small amout on L tonsil) present.   Eyes: Pupils are equal, round, and reactive to light. Conjunctivae are normal.   Neck: Normal range of motion.   Cardiovascular: Normal rate, regular rhythm and normal heart sounds.    Pulmonary/Chest: Effort normal and breath sounds normal.   Abdominal: Soft. There is no tenderness.   Genitourinary: Vaginal discharge (thick white) found.   Genitourinary Comments: No CMT   Neurological: She is alert  and oriented to person, place, and time.   Skin: Skin is warm. Capillary refill takes less than 2 seconds.   Psychiatric: She has a normal mood and affect.   Nursing note and vitals reviewed.        Diagnostic Study Results     Labs -     Results     Procedure Component Value Units Date/Time    Rapid Strep [[id number]] Collected:  03/18/17 0435    Specimen:  Throat Updated:  03/18/17 0444     Group A Strep, Rapid Antigen Negative    Wet Prep Trichomonas [[id number]] Collected:  03/18/17 0414    Specimen:  Vaginal Swab Updated:  03/18/17 0425    Narrative:       ORDER#: [id number]                                    ORDERED BY: Natasha Mead  SOURCE: Vaginal Swab                                 COLLECTED:  03/18/17 04:14  ANTIBIOTICS AT COLL.:                                RECEIVED :  03/18/17 04:16  Wet Prep Trichomonas                        FINAL       03/18/17 04:25  03/18/17   No Trichomonas or Yeast Seen             Reference Range: No Trichomonas or Yeast Seen      Urine HCG, Qualitative [[id number]] Collected:  03/18/17 0347    Specimen:  Urine Updated:  03/18/17 0414     Urine HCG Qualitative Negative    UA with reflex to micro (pts  3 + yrs) [[id number]] Collected:  03/18/17 0347    Specimen:  Urine Updated:  03/18/17 0414     Urine Type Clean Catch     Color, UA Yellow     Clarity, UA Clear     Specific Gravity UA >1.030     Urine pH 6.0     Leukocyte Esterase, UA Negative     Nitrite, UA Negative     Protein, UR Negative     Glucose, UA Negative     Ketones UA Negative     Urobilinogen, UA 0.2 mg/dL      Bilirubin, UA Negative     Blood, UA Negative    Chlamydia/GC by PCR [[id number]] Collected:  03/18/17 0414     Updated:  03/18/17 0414          Radiologic Studies -   Radiology Results (24 Hour)     ** No results found for the last 24 hours. **      .    Medical Decision Making   I am the first provider for this patient.    I reviewed the vital signs, available nursing notes, past medical history, past surgical history, family history and social history.    Vital Signs-Reviewed the patient's vital signs.     Patient Vitals for the past 12 hrs:   BP Temp Pulse Resp   03/18/17 0342 115/68 98 F (36.7 C) 89  16       Pulse Oximetry Analysis - Normal 97% on RA        Old Medical Records: Old medical records.  Nursing notes.     ED Course: 4:30 AM - Discussed results with pt and counseled on diagnosis, f/u plans, medication use, and signs and symptoms when to return to ED.  Pt is stable and ready for discharge.         Provider Notes: 26 yo female with multiple visits for vaginitis/BV.  LAst one in September presents with similar sxs.  C/o abdominal pain, but no ttp.  UA clear.  Discharge on exam, but no CMT or ttp, do not suspect PID.  UA clear, not pregnant.  No BV, yeast or trich on pelvic.  Also concerned about sore throat  and small exudate on L tonsil, but rapid strep negative.  Will treat empirically for GC/Chlamydia.  Discussed strict returns, GYN follow-up    Critical Care Time:    Diagnosis     Clinical Impression:   1. Vaginal discharge    2. Acute vaginitis        Treatment Plan:   ED Disposition     ED Disposition Condition Date/Time Comment    Discharge  Sun Mar 18, 2017  4:45 AM Anita Huffman discharge to home/self care.    Condition at disposition: Stable            Ashley Jacobs, MD  03/18/17 [phone]

## 2017-03-19 LAB — GENITAL CHLAMYDIA/NEISSERIA BY PCR
Chlamydia DNA by PCR: NEGATIVE
Neisseria gonorrhoeae by PCR: NEGATIVE

## 2020-08-25 ENCOUNTER — Ambulatory Visit: Payer: Self-pay | Admitting: Family Medicine

## 2020-10-14 ENCOUNTER — Other Ambulatory Visit: Payer: Self-pay

## 2020-10-14 ENCOUNTER — Emergency Department (HOSPITAL_COMMUNITY)
Admission: EM | Admit: 2020-10-14 | Discharge: 2020-10-14 | Disposition: A | Discharge status: Home / Self Care | Payer: Medicaid Other | Attending: Emergency Medicine | Admitting: Emergency Medicine

## 2020-10-14 ENCOUNTER — Encounter (HOSPITAL_COMMUNITY): Payer: Self-pay

## 2020-10-14 DIAGNOSIS — R197 Diarrhea, unspecified: Secondary | ICD-10-CM | POA: Diagnosis not present

## 2020-10-14 DIAGNOSIS — R1084 Generalized abdominal pain: Secondary | ICD-10-CM

## 2020-10-14 DIAGNOSIS — R519 Headache, unspecified: Secondary | ICD-10-CM | POA: Insufficient documentation

## 2020-10-14 DIAGNOSIS — R110 Nausea: Secondary | ICD-10-CM | POA: Diagnosis not present

## 2020-10-14 DIAGNOSIS — R109 Unspecified abdominal pain: Secondary | ICD-10-CM | POA: Diagnosis present

## 2020-10-14 LAB — CBC
HCT: 37.3 % (ref 36.0–46.0)
Hemoglobin: 12.4 g/dL (ref 12.0–15.0)
MCH: 29.7 pg (ref 26.0–34.0)
MCHC: 33.2 g/dL (ref 30.0–36.0)
MCV: 89.4 fL (ref 80.0–100.0)
Platelets: 251 10*3/uL (ref 150–400)
RBC: 4.17 MIL/uL (ref 3.87–5.11)
RDW: 12 % (ref 11.5–15.5)
WBC: 3.1 10*3/uL — ABNORMAL LOW (ref 4.0–10.5)
nRBC: 0 % (ref 0.0–0.2)

## 2020-10-14 LAB — COMPREHENSIVE METABOLIC PANEL
ALT: 16 U/L (ref 0–44)
AST: 19 U/L (ref 15–41)
Albumin: 4.1 g/dL (ref 3.5–5.0)
Alkaline Phosphatase: 48 U/L (ref 38–126)
Anion gap: 6 (ref 5–15)
BUN: 10 mg/dL (ref 6–20)
CO2: 27 mmol/L (ref 22–32)
Calcium: 9.1 mg/dL (ref 8.9–10.3)
Chloride: 106 mmol/L (ref 98–111)
Creatinine, Ser: 0.72 mg/dL (ref 0.44–1.00)
GFR, Estimated: >60 mL/min (ref >60)
Glucose, Bld: 94 mg/dL (ref 70–99)
Potassium: 4 mmol/L (ref 3.5–5.1)
Sodium: 139 mmol/L (ref 135–145)
Total Bilirubin: 0.6 mg/dL (ref 0.3–1.2)
Total Protein: 7.7 g/dL (ref 6.5–8.1)

## 2020-10-14 LAB — LIPASE, BLOOD: Lipase: 35 U/L (ref 11–51)

## 2020-10-14 LAB — I-STAT BETA HCG BLOOD, ED (MC, WL, AP ONLY): I-stat hCG, quantitative: <5 m[IU]/mL (ref <5)

## 2020-10-14 MED ORDER — DICYCLOMINE HCL 10 MG PO CAPS
10.0000 mg | ORAL_CAPSULE | Freq: Once | ORAL | Status: AC
  Administered 2020-10-14: 10 mg via ORAL
  Filled 2020-10-14: qty 1

## 2020-10-14 MED ORDER — DICYCLOMINE HCL 20 MG PO TABS: 20.0000 mg | ORAL_TABLET | Freq: Two times a day (BID) | ORAL | 0 refills | Status: DC

## 2020-10-14 NOTE — ED Triage Notes (Signed)
Patient c/o abdominal cramping and nausea that started this AM.

## 2020-10-14 NOTE — ED Provider Notes (Signed)
[facility] DEPT Provider Note   CSN: [id number] Arrival date & time: 10/14/20  0734     History Chief Complaint  Patient presents with   Abdominal Pain    Lori Meyer is a 30 y.o. female.  30 year old female presents with complaint of abdominal cramping with nausea and loose stools.  Symptoms started this morning when she got to work and began to feel cramping went to the bathroom and had loose bowel movement without blood or mucus present.  Denies fevers, chills, vomiting, changes in bladder habits.  Reports similar episode 3 days ago after eating fast food.  Also reports intermittent headache which improved with Aleve.  No other complaints or concerns today. No known sick contacts.      History reviewed. No pertinent past medical history.  There are no problems to display for this patient.   History reviewed. No pertinent surgical history.   OB History   No obstetric history on file.     Family History  Family history unknown: Yes    Social History   Tobacco Use   Smoking status: Never   Smokeless tobacco: Never  Vaping Use   Vaping Use: Never used  Substance Use Topics   Alcohol use: Yes    Comment: occasionally   Drug use: Never    Home Medications Prior to Admission medications   Medication Sig Start Date End Date Taking? Authorizing Provider  dicyclomine (BENTYL) 20 MG tablet Take 1 tablet (20 mg total) by mouth 2 (two) times daily for 5 days. 10/14/20 10/19/20 Yes Tacy Learn, PA-C    Allergies    Other  Review of Systems   Review of Systems  Constitutional:  Negative for chills and fever.  Respiratory:  Negative for shortness of breath.   Cardiovascular:  Negative for chest pain.  Gastrointestinal:  Positive for abdominal pain, diarrhea and nausea. Negative for blood in stool, constipation and vomiting.  Genitourinary:  Negative for dysuria.  Musculoskeletal:  Negative for arthralgias and myalgias.  Skin:   Negative for rash and wound.  Allergic/Immunologic: Negative for immunocompromised state.  Neurological:  Positive for headaches.  Hematological:  Negative for adenopathy.  Psychiatric/Behavioral:  Negative for confusion.   All other systems reviewed and are negative.  Physical Exam Updated Vital Signs BP (!) 114/93   Pulse 68   Temp 97.8 F (36.6 C)   Resp 16   Ht 5\' 2"  (1.575 m)   Wt 73.9 kg   LMP 09/18/2020 (Approximate)   SpO2 100%   BMI 29.81 kg/m   Physical Exam Vitals and nursing note reviewed.  Constitutional:      General: She is not in acute distress.    Appearance: She is well-developed. She is not diaphoretic.  HENT:     Head: Normocephalic and atraumatic.  Cardiovascular:     Rate and Rhythm: Normal rate and regular rhythm.  Pulmonary:     Effort: Pulmonary effort is normal.     Breath sounds: Normal breath sounds.  Abdominal:     Palpations: Abdomen is soft.     Tenderness: There is generalized abdominal tenderness. There is no right CVA tenderness or left CVA tenderness.  Skin:    General: Skin is warm and dry.     Findings: No erythema or rash.  Neurological:     Mental Status: She is alert and oriented to person, place, and time.  Psychiatric:        Behavior: Behavior normal.    ED  Results / Procedures / Treatments   Labs (all labs ordered are listed, but only abnormal results are displayed) Labs Reviewed  CBC - Abnormal; Notable for the following components:      Result Value   WBC 3.1 (*)    All other components within normal limits  LIPASE, BLOOD  COMPREHENSIVE METABOLIC PANEL  URINALYSIS, ROUTINE W REFLEX MICROSCOPIC  I-STAT BETA HCG BLOOD, ED ([facility], [facility], [facility] ONLY)    EKG None  Radiology No results found.  Procedures Procedures   Medications Ordered in ED Medications  dicyclomine (BENTYL) capsule 10 mg (10 mg Oral Given 10/14/20 7510)    ED Course  I have reviewed the triage vital signs and the nursing notes.  Pertinent labs  & imaging results that were available during my care of the patient were reviewed by me and considered in my medical decision making (see chart for details).  Clinical Course as of 10/14/20 1040  Thu Oct 15, 8447  5341 30 year old female with complaint generalized abdominal cramping with loose stools today.  Found to have mild generalized abdominal pain on exam. Lab work shows normal metabolic panel, no evidence of dehydration.  CBC with white blood cell count of 3.1, otherwise unremarkable.  Lipase within normal limits, hCG negative.  Vitals stable including room air oxygen saturation of 100%. Pain has improved with Bentyl. Plan is to follow-up with PCP if symptoms persist, may take Bentyl as prescribed, given return to ER precautions.  Patient verbalizes understanding of discharge instructions and plan. [LM]    Clinical Course User Index [LM] Roque Lias   MDM Rules/Calculators/A&P                           Final Clinical Impression(s) / ED Diagnoses Final diagnoses:  Generalized abdominal pain  Diarrhea, unspecified type    Rx / DC Orders ED Discharge Orders          Ordered    dicyclomine (BENTYL) 20 MG tablet  2 times daily        10/14/20 1037             Roque Lias 10/14/20 1040    Carmin Muskrat, MD 10/14/20 1101

## 2020-10-14 NOTE — Discharge Instructions (Addendum)
Take Bentyl as prescribed.  Recheck with your doctor if symptoms continue, return to ED for new or worsening symptoms.

## 2020-12-07 ENCOUNTER — Other Ambulatory Visit: Payer: Self-pay

## 2020-12-07 ENCOUNTER — Ambulatory Visit (HOSPITAL_COMMUNITY)
Admission: EM | Admit: 2020-12-07 | Discharge: 2020-12-07 | Disposition: A | Discharge status: Home / Self Care | Payer: Medicaid Other | Attending: Emergency Medicine | Admitting: Emergency Medicine

## 2020-12-07 ENCOUNTER — Encounter (HOSPITAL_COMMUNITY): Payer: Self-pay

## 2020-12-07 DIAGNOSIS — R591 Generalized enlarged lymph nodes: Secondary | ICD-10-CM | POA: Diagnosis not present

## 2020-12-07 DIAGNOSIS — L210 Seborrhea capitis: Secondary | ICD-10-CM

## 2020-12-07 MED ORDER — KETOCONAZOLE 2 % EX SHAM: 1.0000 "application " | MEDICATED_SHAMPOO | CUTANEOUS | 0 refills | Status: AC

## 2020-12-07 NOTE — Discharge Instructions (Addendum)
Use the Nixoral shampoo once or twice a week to help with itching and irritation.   You can take Tylenol and/or Ibuprofen as needed for pain.   You can apply a warm compress or ice to the swollen lymph node for comfort.   Follow up with your primary care provider for re-evaluation.  Return or go to the Emergency Department if symptoms worsen or do not improve in the next few days.

## 2020-12-07 NOTE — ED Provider Notes (Signed)
[facility]    CSN: [id number] Arrival date & time: 12/07/20  [id number]      History   Chief Complaint Chief Complaint  Patient presents with   Hair/Scalp Problem    HPI Lori Meyer is a 30 y.o. female.   Patient here for evaluation of scalp irritation and itching that has been ongoing for the past week.  Also reports that she recently noticed some swelling to her right neck.  Has not tried any OTC medications or treatments. Denies any trauma, injury, or other precipitating event.  Denies any specific alleviating or aggravating factors.  Denies any fevers, chest pain, shortness of breath, N/V/D, numbness, tingling, weakness, abdominal pain, or headaches.    The history is provided by the patient.   History reviewed. No pertinent past medical history.  There are no problems to display for this patient.   History reviewed. No pertinent surgical history.  OB History   No obstetric history on file.      Home Medications    Prior to Admission medications   Medication Sig Start Date End Date Taking? Authorizing Provider  ketoconazole (NIZORAL) 2 % shampoo Apply 1 application topically 2 (two) times a week for 7 days. 12/09/20 12/16/20 Yes Pearson Forster, NP  dicyclomine (BENTYL) 20 MG tablet Take 1 tablet (20 mg total) by mouth 2 (two) times daily for 5 days. 10/14/20 10/19/20  Tacy Learn, PA-C    Family History Family History  Family history unknown: Yes    Social History Social History   Tobacco Use   Smoking status: Never   Smokeless tobacco: Never  Vaping Use   Vaping Use: Never used  Substance Use Topics   Alcohol use: Yes    Comment: occasionally   Drug use: Never     Allergies   Acetaminophen-dm, Guaifenesin, and Other   Review of Systems Review of Systems  Skin:  Positive for rash.  Hematological:  Positive for adenopathy.  All other systems reviewed and are negative.   Physical Exam Triage Vital Signs ED Triage Vitals  Enc  Vitals Group     BP 12/07/20 0840 123/71     Pulse Rate 12/07/20 0840 80     Resp 12/07/20 0840 16     Temp 12/07/20 0840 98.7 F (37.1 C)     Temp Source 12/07/20 0840 Oral     SpO2 12/07/20 0840 100 %     Weight --      Height --      Head Circumference --      Peak Flow --      Pain Score 12/07/20 0839 0     Pain Loc --      Pain Edu? --      Excl. in [place]? --    No data found.  Updated Vital Signs BP 123/71 (BP Location: Left Arm)   Pulse 80   Temp 98.7 F (37.1 C) (Oral)   Resp 16   LMP  (Approximate) Comment: 2 months  SpO2 100%   Visual Acuity Right Eye Distance:   Left Eye Distance:   Bilateral Distance:    Right Eye Near:   Left Eye Near:    Bilateral Near:     Physical Exam Vitals and nursing note reviewed.  Constitutional:      General: She is not in acute distress.    Appearance: Normal appearance. She is not ill-appearing, toxic-appearing or diaphoretic.  HENT:     Head: Normocephalic and atraumatic.  Eyes:     Conjunctiva/sclera: Conjunctivae normal.  Cardiovascular:     Rate and Rhythm: Normal rate.     Pulses: Normal pulses.  Pulmonary:     Effort: Pulmonary effort is normal.  Abdominal:     General: Abdomen is flat.  Musculoskeletal:        General: Normal range of motion.     Cervical back: Normal range of motion.  Lymphadenopathy:     Head:     Right side of head: Submandibular (nonmobile, tender) adenopathy present.  Skin:    General: Skin is warm and dry.     Findings: Rash present. Rash is scaling (scaling rash to scalp).  Neurological:     General: No focal deficit present.     Mental Status: She is alert and oriented to person, place, and time.  Psychiatric:        Mood and Affect: Mood normal.     UC Treatments / Results  Labs (all labs ordered are listed, but only abnormal results are displayed) Labs Reviewed - No data to display  EKG   Radiology No results found.  Procedures Procedures (including critical care  time)  Medications Ordered in UC Medications - No data to display  Initial Impression / Assessment and Plan / UC Course  I have reviewed the triage vital signs and the nursing notes.  Pertinent labs & imaging results that were available during my care of the patient were reviewed by me and considered in my medical decision making (see chart for details).    Assessment negative for red flags or concerns.  Likely seborrhea capitis.  Will treat with Nizoral shampoo up to twice a week.  Lymphadenopathy.  May take Tylenol and/or ibuprofen as needed.  May apply warm or cool compress for comfort.  Recommend following up with primary care provider for reevaluation of lymphadenopathy if it does not improve.  PCP assistance started. Final Clinical Impressions(s) / UC Diagnoses   Final diagnoses:  Seborrhea capitis in adult  Lymphadenopathy     Discharge Instructions      Use the Nixoral shampoo once or twice a week to help with itching and irritation.   You can take Tylenol and/or Ibuprofen as needed for pain.   You can apply a warm compress or ice to the swollen lymph node for comfort.   Follow up with your primary care provider for re-evaluation.  Return or go to the Emergency Department if symptoms worsen or do not improve in the next few days.       ED Prescriptions     Medication Sig Dispense Auth. Provider   ketoconazole (NIZORAL) 2 % shampoo Apply 1 application topically 2 (two) times a week for 7 days. 120 mL Pearson Forster, NP      PDMP not reviewed this encounter.   Pearson Forster, NP 12/07/20 [phone]

## 2020-12-07 NOTE — ED Triage Notes (Signed)
Pt reports scalp is swelling 1 week; lymph nodes in the neck are swelling x 2 days.

## 2021-02-10 ENCOUNTER — Emergency Department (HOSPITAL_BASED_OUTPATIENT_CLINIC_OR_DEPARTMENT_OTHER)
Admission: EM | Admit: 2021-02-10 | Discharge: 2021-02-10 | Disposition: A | Discharge status: Home / Self Care | Payer: Medicaid Other | Attending: Emergency Medicine | Admitting: Emergency Medicine

## 2021-02-10 ENCOUNTER — Other Ambulatory Visit: Payer: Self-pay

## 2021-02-10 ENCOUNTER — Emergency Department (HOSPITAL_BASED_OUTPATIENT_CLINIC_OR_DEPARTMENT_OTHER): Payer: Medicaid Other

## 2021-02-10 ENCOUNTER — Encounter (HOSPITAL_BASED_OUTPATIENT_CLINIC_OR_DEPARTMENT_OTHER): Payer: Self-pay

## 2021-02-10 DIAGNOSIS — X58XXXA Exposure to other specified factors, initial encounter: Secondary | ICD-10-CM | POA: Diagnosis not present

## 2021-02-10 DIAGNOSIS — R748 Abnormal levels of other serum enzymes: Secondary | ICD-10-CM

## 2021-02-10 DIAGNOSIS — S29012A Strain of muscle and tendon of back wall of thorax, initial encounter: Secondary | ICD-10-CM | POA: Insufficient documentation

## 2021-02-10 DIAGNOSIS — Y990 Civilian activity done for income or pay: Secondary | ICD-10-CM | POA: Diagnosis not present

## 2021-02-10 DIAGNOSIS — R160 Hepatomegaly, not elsewhere classified: Secondary | ICD-10-CM | POA: Insufficient documentation

## 2021-02-10 DIAGNOSIS — S29011A Strain of muscle and tendon of front wall of thorax, initial encounter: Secondary | ICD-10-CM

## 2021-02-10 DIAGNOSIS — S299XXA Unspecified injury of thorax, initial encounter: Secondary | ICD-10-CM | POA: Diagnosis present

## 2021-02-10 DIAGNOSIS — S3991XA Unspecified injury of abdomen, initial encounter: Secondary | ICD-10-CM | POA: Diagnosis not present

## 2021-02-10 LAB — URINALYSIS, ROUTINE W REFLEX MICROSCOPIC
Bilirubin Urine: NEGATIVE
Glucose, UA: NEGATIVE mg/dL
Hgb urine dipstick: NEGATIVE
Ketones, ur: NEGATIVE mg/dL
Leukocytes,Ua: NEGATIVE
Nitrite: NEGATIVE
Protein, ur: NEGATIVE mg/dL
Specific Gravity, Urine: 1.029 (ref 1.005–1.030)
pH: 6 (ref 5.0–8.0)

## 2021-02-10 LAB — CBC WITH DIFFERENTIAL/PLATELET
Abs Immature Granulocytes: 0.01 10*3/uL (ref 0.00–0.07)
Basophils Absolute: 0 10*3/uL (ref 0.0–0.1)
Basophils Relative: 1 %
Eosinophils Absolute: 0.1 10*3/uL (ref 0.0–0.5)
Eosinophils Relative: 1 %
HCT: 39 % (ref 36.0–46.0)
Hemoglobin: 12.8 g/dL (ref 12.0–15.0)
Immature Granulocytes: 0 %
Lymphocytes Relative: 43 %
Lymphs Abs: 2.3 10*3/uL (ref 0.7–4.0)
MCH: 29.2 pg (ref 26.0–34.0)
MCHC: 32.8 g/dL (ref 30.0–36.0)
MCV: 88.8 fL (ref 80.0–100.0)
Monocytes Absolute: 0.6 10*3/uL (ref 0.1–1.0)
Monocytes Relative: 12 %
Neutro Abs: 2.4 10*3/uL (ref 1.7–7.7)
Neutrophils Relative %: 43 %
Platelets: 312 10*3/uL (ref 150–400)
RBC: 4.39 MIL/uL (ref 3.87–5.11)
RDW: 12 % (ref 11.5–15.5)
WBC: 5.4 10*3/uL (ref 4.0–10.5)
nRBC: 0 % (ref 0.0–0.2)

## 2021-02-10 LAB — COMPREHENSIVE METABOLIC PANEL
ALT: 10 U/L (ref 0–44)
AST: 15 U/L (ref 15–41)
Albumin: 4.3 g/dL (ref 3.5–5.0)
Alkaline Phosphatase: 51 U/L (ref 38–126)
Anion gap: 8 (ref 5–15)
BUN: 10 mg/dL (ref 6–20)
CO2: 25 mmol/L (ref 22–32)
Calcium: 8.4 mg/dL — ABNORMAL LOW (ref 8.9–10.3)
Chloride: 106 mmol/L (ref 98–111)
Creatinine, Ser: 0.71 mg/dL (ref 0.44–1.00)
GFR, Estimated: >60 mL/min (ref >60)
Glucose, Bld: 91 mg/dL (ref 70–99)
Potassium: 3.7 mmol/L (ref 3.5–5.1)
Sodium: 139 mmol/L (ref 135–145)
Total Bilirubin: 0.4 mg/dL (ref 0.3–1.2)
Total Protein: 7.6 g/dL (ref 6.5–8.1)

## 2021-02-10 LAB — PREGNANCY, URINE: Preg Test, Ur: NEGATIVE

## 2021-02-10 LAB — LIPASE, BLOOD: Lipase: 65 U/L — ABNORMAL HIGH (ref 11–51)

## 2021-02-10 MED ORDER — KETOROLAC TROMETHAMINE 30 MG/ML IJ SOLN
30.0000 mg | Freq: Once | INTRAMUSCULAR | Status: AC
  Administered 2021-02-10: 30 mg via INTRAVENOUS
  Filled 2021-02-10: qty 1

## 2021-02-10 NOTE — ED Notes (Signed)
Patient discharged home to self.  All discharge instructions reviewed.  Patient verbalized understanding via teachback method.  VS WDL.  Ambulatory out of ED.

## 2021-02-10 NOTE — Discharge Instructions (Signed)
Apply ice for 30 minutes at a time, 4 times a day.  Take ibuprofen or naproxen as needed for pain.  If you need additional pain relief, add acetaminophen.  Combining acetaminophen with either ibuprofen or naproxen gives you better pain relief than you get with either medication by itself.  Your CT scan showed a mass in your liver.  The radiologist is recommending you get an MRI scan to try to tell what the mass is.  Please make arrangements to have that done.

## 2021-02-10 NOTE — ED Provider Notes (Signed)
[name] DEPT Provider Note   CSN: [id number] Arrival date & time: 02/10/21  5093     History Chief Complaint  Patient presents with   Flank Pain    Right    Lori Meyer is a 30 y.o. female.  The history is provided by the patient.  Flank Pain He had onset yesterday afternoon of sharp pain in the right flank without radiation.  Pain is worse if she twists or if she takes a deep breath.  It is only there for a few seconds before resolving.  There is no associated dyspnea.  She denies any nausea, vomiting, diarrhea and denies any urinary difficulty.  She has not taken anything for pain at home.  She denies any trauma or unusual bending or lifting or twisting.   No past medical history on file.  There are no problems to display for this patient.   No past surgical history on file.   OB History   No obstetric history on file.     Family History  Family history unknown: Yes    Social History   Tobacco Use   Smoking status: Never   Smokeless tobacco: Never  Vaping Use   Vaping Use: Never used  Substance Use Topics   Alcohol use: Yes    Comment: occasionally   Drug use: Never    Home Medications Prior to Admission medications   Medication Sig Start Date End Date Taking? Authorizing Provider  dicyclomine (BENTYL) 20 MG tablet Take 1 tablet (20 mg total) by mouth 2 (two) times daily for 5 days. 10/14/20 10/19/20  Tacy Learn, PA-C    Allergies    Acetaminophen-dm, Guaifenesin, and Other  Review of Systems   Review of Systems  Genitourinary:  Positive for flank pain.  All other systems reviewed and are negative.  Physical Exam Updated Vital Signs BP 126/83   Pulse 70   Temp 98.6 F (37 C) (Oral)   Resp 18   Ht 5\' 2"  (1.575 m)   Wt 72.6 kg   LMP 02/05/2021 (Exact Date) Comment: negative pregnancy test  SpO2 99%   BMI 29.26 kg/m   Physical Exam Vitals and nursing note reviewed.  30 year old female, resting  comfortably and in no acute distress. Vital signs are normal. Oxygen saturation is 99%, which is normal. Head is normocephalic and atraumatic. PERRLA, EOMI. Oropharynx is clear. Neck is nontender and supple without adenopathy or JVD. Back is moderately tender over the left posterior lateral rib cage inferiorly.  This does reproduce her pain.  No crepitus is noted. Lungs are clear without rales, wheezes, or rhonchi. Chest is nontender. Heart has regular rate and rhythm without murmur. Abdomen is soft, flat, nontender without masses or hepatosplenomegaly and peristalsis is normoactive. Extremities have no cyanosis or edema, full range of motion is present. Skin is warm and dry without rash. Neurologic: Mental status is normal, cranial nerves are intact, moves all extremities equally.  ED Results / Procedures / Treatments   Labs (all labs ordered are listed, but only abnormal results are displayed) Labs Reviewed  COMPREHENSIVE METABOLIC PANEL - Abnormal; Notable for the following components:      Result Value   Calcium 8.4 (*)    All other components within normal limits  LIPASE, BLOOD - Abnormal; Notable for the following components:   Lipase 65 (*)    All other components within normal limits  CBC WITH DIFFERENTIAL/PLATELET  PREGNANCY, URINE  URINALYSIS, ROUTINE W REFLEX MICROSCOPIC  Radiology CT Renal Stone Study  Result Date: 02/10/2021 CLINICAL DATA:  Right flank pain which began yesterday at work. Kidney stone suspected EXAM: CT ABDOMEN AND PELVIS WITHOUT CONTRAST TECHNIQUE: Multidetector CT imaging of the abdomen and pelvis was performed following the standard protocol without IV contrast. COMPARISON:  None. FINDINGS: Lower chest:  No contributory findings. Hepatobiliary: 4 cm mass along the upper left lobe liver, isointense and nonspecific. Adjacent fluid density cleft which may be trapped peritoneal fluid or band of scarring. No evidence of acute hemorrhage to correlate with  acute symptoms.No evidence of biliary obstruction or stone. Pancreas: Unremarkable. Spleen: Unremarkable. Adrenals/Urinary Tract: Negative adrenals. No hydronephrosis or stone. Unremarkable bladder. Stomach/Bowel:  No obstruction. No appendicitis. Vascular/Lymphatic: No acute vascular abnormality. No mass or adenopathy. Reproductive:No pathologic findings.  Tampon in place. Other: No ascites or pneumoperitoneum. Musculoskeletal: No acute abnormalities. IMPRESSION: 1. 4 cm mass in the left lobe liver. Recommend liver MRI with contrast. 2. No emergent finding.  No hydronephrosis or urolithiasis. Electronically Signed   By: Jorje Guild M.D.   On: 02/10/2021 06:17    Procedures Procedures   Medications Ordered in ED Medications  ketorolac (TORADOL) 30 MG/ML injection 30 mg (has no administration in time range)    ED Course  I have reviewed the triage vital signs and the nursing notes.  Pertinent labs & imaging results that were available during my care of the patient were reviewed by me and considered in my medical decision making (see chart for details).   MDM Rules/Calculators/A&P                         Right flank pain.  Exam is strongly suggestive of either occult rib fracture or intercostal muscle strain.  Old records are reviewed, and she has no relevant past visits.  We will check screening labs and get a renal stone protocol CT scan.  She will be given a therapeutic trial of ketorolac.  Labs showed mild elevation of lipase which is not felt to be clinically significant.  Urinalysis is normal.  CT shows no evidence of urolithiasis, no evidence of rib fracture.  Incidental finding of a 4 cm mass in the left lobe of the liver.  This will need to be followed with outpatient MRI scan.  Patient is advised of this finding.  She states she is not feeling any better with ketorolac, but she is noted to be able to move without difficulty.  She is advised to use over-the-counter NSAIDs and  acetaminophen as needed for pain, apply ice.  Return precautions discussed.  Final Clinical Impression(s) / ED Diagnoses Final diagnoses:  Intercostal muscle strain, initial encounter  Liver mass  Elevated lipase    Rx / DC Orders ED Discharge Orders     None        Delora Fuel, MD 29/56/21 [phone]

## 2021-02-10 NOTE — ED Triage Notes (Signed)
Patient presents with R flank pain which began yesterday while sitting at work.  Denies injury or heavy lifting.  Denies previous episodes of the pain.  She states she then got a massage which worsened the pain.  She states pain has gotten progressively worse since it began.  Denies nausea/vomiting.  Denies visible blood in urine.

## 2021-03-07 ENCOUNTER — Other Ambulatory Visit: Payer: Self-pay

## 2021-03-07 ENCOUNTER — Ambulatory Visit (INDEPENDENT_AMBULATORY_CARE_PROVIDER_SITE_OTHER): Payer: Medicaid Other | Admitting: Physician Assistant

## 2021-03-07 ENCOUNTER — Encounter: Payer: Self-pay | Admitting: Physician Assistant

## 2021-03-07 VITALS — BP 124/82 | HR 88 | Temp 98.2°F | Ht 62.0 in | Wt 163.2 lb

## 2021-03-07 DIAGNOSIS — R748 Abnormal levels of other serum enzymes: Secondary | ICD-10-CM | POA: Diagnosis not present

## 2021-03-07 DIAGNOSIS — R160 Hepatomegaly, not elsewhere classified: Secondary | ICD-10-CM

## 2021-03-07 LAB — COMPREHENSIVE METABOLIC PANEL
ALT: 13 U/L (ref 0–35)
AST: 17 U/L (ref 0–37)
Albumin: 4.3 g/dL (ref 3.5–5.2)
Alkaline Phosphatase: 47 U/L (ref 39–117)
BUN: 10 mg/dL (ref 6–23)
CO2: 27 mEq/L (ref 19–32)
Calcium: 9.6 mg/dL (ref 8.4–10.5)
Chloride: 105 mEq/L (ref 96–112)
Creatinine, Ser: 0.77 mg/dL (ref 0.40–1.20)
GFR: 103.87 mL/min (ref >60.00)
Glucose, Bld: 81 mg/dL (ref 70–99)
Potassium: 4.3 mEq/L (ref 3.5–5.1)
Sodium: 138 mEq/L (ref 135–145)
Total Bilirubin: 0.6 mg/dL (ref 0.2–1.2)
Total Protein: 7.3 g/dL (ref 6.0–8.3)

## 2021-03-07 LAB — LIPASE: Lipase: 45 U/L (ref 11.0–59.0)

## 2021-03-07 NOTE — Patient Instructions (Addendum)
Good to meet you today! Please go to the lab for blood work and I will send results through [facility]. MRI Liver with contrast ordered - someone will call to schedule  Recheck sooner if any concerns.

## 2021-03-07 NOTE — Progress Notes (Signed)
Subjective:    Patient ID: Lori Meyer, female    DOB: [birth year], 30 y.o.   MRN: [id number]  Chief Complaint  Patient presents with   Mass    Mass on Liver     HPI 30 y.o. patient presents today for new patient establishment with me.  Patient was previously established with no primary care in a long time.   Current Care Team: GYN - appt coming up this week   Acute Concerns: -Incidental liver mass found on 02/10/2021. No pain or symptoms. Needs f/up imaging.   -Intermittent migraines, ?hives broke out around time of ED visit (all resolved now).  Chronic Concerns: No major medical history.    History reviewed. No pertinent past medical history.  History reviewed. No pertinent surgical history.  Family History  Family history unknown: Yes    Social History   Tobacco Use   Smoking status: Never    Passive exposure: Never   Smokeless tobacco: Never  Vaping Use   Vaping Use: Never used  Substance Use Topics   Alcohol use: Yes    Comment: occasionally   Drug use: Yes    Types: Marijuana     Allergies  Allergen Reactions   Acetaminophen-Dm     Other reaction(s): Other   Guaifenesin Diarrhea and Other (See Comments)    Other reaction(s): Other (See Comments) abd pain abd pain Unknown    Other     Robitussin    Review of Systems NEGATIVE UNLESS OTHERWISE INDICATED IN HPI      Objective:     BP 124/82   Pulse 88   Temp 98.2 F (36.8 C)   Ht 5\' 2"  (1.575 m)   Wt 163 lb 3.2 oz (74 kg)   LMP 02/05/2021 (Exact Date) Comment: negative pregnancy test  SpO2 100%   BMI 29.85 kg/m   Wt Readings from Last 3 Encounters:  03/07/21 163 lb 3.2 oz (74 kg)  02/10/21 160 lb (72.6 kg)  10/14/20 163 lb (73.9 kg)    BP Readings from Last 3 Encounters:  03/07/21 124/82  02/10/21 126/83  12/07/20 123/71     Physical Exam Vitals and nursing note reviewed.  Constitutional:      Appearance: Normal appearance. She is normal weight. She is not  toxic-appearing.  HENT:     Head: Normocephalic and atraumatic.     Right Ear: Tympanic membrane, ear canal and external ear normal.     Left Ear: Tympanic membrane, ear canal and external ear normal.     Nose: Nose normal.     Mouth/Throat:     Mouth: Mucous membranes are moist.  Eyes:     Extraocular Movements: Extraocular movements intact.     Conjunctiva/sclera: Conjunctivae normal.     Pupils: Pupils are equal, round, and reactive to light.  Cardiovascular:     Rate and Rhythm: Normal rate and regular rhythm.     Pulses: Normal pulses.     Heart sounds: Normal heart sounds.  Pulmonary:     Effort: Pulmonary effort is normal.     Breath sounds: Normal breath sounds.  Abdominal:     General: Abdomen is flat. Bowel sounds are normal. There is no distension.     Palpations: Abdomen is soft. There is no mass.     Tenderness: There is no abdominal tenderness. There is no right CVA tenderness, left CVA tenderness, guarding or rebound.  Musculoskeletal:        General: Normal range of motion.  Cervical back: Normal range of motion and neck supple.  Skin:    General: Skin is warm and dry.  Neurological:     General: No focal deficit present.     Mental Status: She is alert and oriented to person, place, and time.  Psychiatric:        Mood and Affect: Mood normal.        Behavior: Behavior normal.        Thought Content: Thought content normal.        Judgment: Judgment normal.       Assessment & Plan:   Problem List Items Addressed This Visit   None Visit Diagnoses     Liver mass, left lobe    -  Primary   Relevant Orders   MR LIVER W CONTRAST   Elevated lipase       Relevant Orders   Lipase   Low calcium levels       Relevant Orders   Comprehensive metabolic panel       1. Liver mass, left lobe 2. Elevated lipase 3. Low calcium levels -I personally reviewed ED report from 02/10/2021 - dx'd as muscle strain, pain symptoms have resolved.  -Lipase sl  elevated, low calcium (pt states lactose intolerant) - recheck labs today - CT renal study report:  Hepatobiliary: 4 cm mass along the upper left lobe liver, isointense and nonspecific. Adjacent fluid density cleft which may be trapped peritoneal fluid or band of scarring. No evidence of acute hemorrhage to correlate with acute symptoms.No evidence of biliary obstruction or stone. -Ordered MRI liver w/contrast today -Pt to f/up after imaging done   Gaylynn Seiple M Chasady Longwell, PA-C

## 2021-03-14 ENCOUNTER — Ambulatory Visit: Payer: Medicaid Other | Admitting: Family

## 2021-03-30 ENCOUNTER — Other Ambulatory Visit: Payer: Self-pay

## 2021-03-30 ENCOUNTER — Ambulatory Visit
Admission: RE | Admit: 2021-03-30 | Discharge: 2021-03-30 | Disposition: A | Discharge status: Home / Self Care | Payer: Medicaid Other | Source: Ambulatory Visit | Attending: Physician Assistant | Admitting: Physician Assistant

## 2021-03-30 DIAGNOSIS — R160 Hepatomegaly, not elsewhere classified: Secondary | ICD-10-CM

## 2021-03-30 MED ORDER — GADOBENATE DIMEGLUMINE 529 MG/ML IV SOLN
15.0000 mL | Freq: Once | INTRAVENOUS | Status: AC | PRN
  Administered 2021-03-30: 16:00:00 15 mL via INTRAVENOUS

## 2021-04-06 ENCOUNTER — Other Ambulatory Visit: Payer: Self-pay

## 2021-04-06 DIAGNOSIS — R160 Hepatomegaly, not elsewhere classified: Secondary | ICD-10-CM

## 2021-05-04 DIAGNOSIS — D376 Neoplasm of uncertain behavior of liver, gallbladder and bile ducts: Secondary | ICD-10-CM | POA: Insufficient documentation

## 2021-05-04 HISTORY — DX: Neoplasm of uncertain behavior of liver, gallbladder and bile ducts: D37.6

## 2021-06-09 ENCOUNTER — Other Ambulatory Visit: Payer: Self-pay | Admitting: Nurse Practitioner

## 2021-06-09 DIAGNOSIS — D376 Neoplasm of uncertain behavior of liver, gallbladder and bile ducts: Secondary | ICD-10-CM

## 2021-06-28 ENCOUNTER — Other Ambulatory Visit: Payer: Self-pay

## 2021-06-28 ENCOUNTER — Ambulatory Visit
Admission: RE | Admit: 2021-06-28 | Discharge: 2021-06-28 | Disposition: A | Discharge status: Home / Self Care | Payer: Medicaid Other | Source: Ambulatory Visit | Attending: Nurse Practitioner | Admitting: Nurse Practitioner

## 2021-06-28 DIAGNOSIS — D376 Neoplasm of uncertain behavior of liver, gallbladder and bile ducts: Secondary | ICD-10-CM

## 2021-06-28 MED ORDER — GADOBENATE DIMEGLUMINE 529 MG/ML IV SOLN
14.0000 mL | Freq: Once | INTRAVENOUS | Status: AC | PRN
  Administered 2021-06-28: 14 mL via INTRAVENOUS

## 2021-07-19 ENCOUNTER — Encounter (HOSPITAL_COMMUNITY): Payer: Self-pay | Admitting: Emergency Medicine

## 2021-07-19 ENCOUNTER — Ambulatory Visit (HOSPITAL_COMMUNITY)
Admission: EM | Admit: 2021-07-19 | Discharge: 2021-07-19 | Disposition: A | Discharge status: Home / Self Care | Payer: Medicaid Other | Attending: Emergency Medicine | Admitting: Emergency Medicine

## 2021-07-19 ENCOUNTER — Other Ambulatory Visit: Payer: Self-pay

## 2021-07-19 DIAGNOSIS — R42 Dizziness and giddiness: Secondary | ICD-10-CM

## 2021-07-19 MED ORDER — MECLIZINE HCL 50 MG PO TABS: 50.0000 mg | ORAL_TABLET | Freq: Three times a day (TID) | ORAL | 0 refills | Status: DC | PRN

## 2021-07-19 NOTE — Discharge Instructions (Signed)
You can take Meclizine up to three times daily for dizziness.  ?

## 2021-07-19 NOTE — ED Provider Notes (Signed)
? ?[facility] ?Provider Note ? ?Patient Contact: 12:47 PM (approximate) ? ? ?History  ? ?Dizziness ? ? ?HPI ? ?Lori Meyer is a 31 y.o. female presents to the urgent care with dizziness that has occurred for the past 1 to 2 weeks.  Patient denies headache, falls, mechanisms of trauma, possibility of pregnancy for ear pain.  Patient states the dizziness is not especially provoked with changes in position.  She has not tried any medications to alleviate her symptoms. ? ?  ? ? ?Physical Exam  ? ?Triage Vital Signs: ?ED Triage Vitals  ?Enc Vitals Group  ?   BP 07/19/21 1219 126/84  ?   Pulse Rate 07/19/21 1219 77  ?   Resp 07/19/21 1219 18  ?   Temp 07/19/21 1219 97.9 ?F (36.6 ?C)  ?   Temp Source 07/19/21 1219 Oral  ?   SpO2 07/19/21 1219 100 %  ?   Weight --   ?   Height --   ?   Head Circumference --   ?   Peak Flow --   ?   Pain Score 07/19/21 1217 0  ?   Pain Loc --   ?   Pain Edu? --   ?   Excl. in [place]? --   ? ? ?Most recent vital signs: ?Vitals:  ? 07/19/21 1219  ?BP: 126/84  ?Pulse: 77  ?Resp: 18  ?Temp: 97.9 ?F (36.6 ?C)  ?SpO2: 100%  ? ? ? ?General: Alert and in no acute distress. ?Eyes:  PERRL. EOMI. ?Head: No acute traumatic findings ?ENT: ?     Ears: Patient has middle ear effusion visualized bilaterally.  ?     Nose: No congestion/rhinnorhea. ?     Mouth/Throat: Mucous membranes are moist. ?Neck: No stridor. No cervical spine tenderness to palpation. ?Cardiovascular:  Good peripheral perfusion ?Respiratory: Normal respiratory effort without tachypnea or retractions. Lungs CTAB. Good air entry to the bases with no decreased or absent breath sounds. ?Gastrointestinal: Bowel sounds ?4 quadrants. Soft and nontender to palpation. No guarding or rigidity. No palpable masses. No distention. No CVA tenderness. ?Musculoskeletal: Full range of motion to all extremities.  ?Neurologic:  No gross focal neurologic deficits are appreciated.  ?Skin:   No rash noted ?Other: ? ? ?ED Results /  Procedures / Treatments  ? ?Labs ?(all labs ordered are listed, but only abnormal results are displayed) ?Labs Reviewed - No data to display ? ? ? ? ?PROCEDURES: ? ?Critical Care performed: No ? ?Procedures ? ? ?MEDICATIONS ORDERED IN ED: ?Medications - No data to display ? ? ?IMPRESSION / MDM / ASSESSMENT AND PLAN / ED COURSE  ?I reviewed the triage vital signs and the nursing notes. ?             ?               ?Assessment and plan:  ?Dizziness:  ?Differential diagnosis includes, but is not limited to, benign positional vertigo, middle ear effusion... ? ?31 year old female presents to urgent care with dizziness that is occurred for the past 1 to 2 weeks.  Will try a trial of meclizine for dizziness and a work note was provided.  No neurodeficits were appreciated on exam. ? ?  ? ? ?FINAL CLINICAL IMPRESSION(S) / ED DIAGNOSES  ? ?Final diagnoses:  ?Dizziness  ? ? ? ?Rx / DC Orders  ? ?ED Discharge Orders   ? ?      Ordered  ?  meclizine (  ANTIVERT) 50 MG tablet  3 times daily PRN       ? 07/19/21 1243  ? ?  ?  ? ?  ? ? ? ?Note:  This document was prepared using Dragon voice recognition software and may include unintentional dictation errors. ?  ?Lannie Fields, PA-C ?07/19/21 1249 ? ?

## 2021-07-19 NOTE — ED Triage Notes (Signed)
Reports dizziness for 1-2 weeks.  Denies pain, denies any uri symptoms. ? ?Patient has had dizziness episodes before,  most recent episodes patient compared to being intoxicated ?

## 2021-08-08 ENCOUNTER — Ambulatory Visit: Payer: Medicaid Other | Admitting: Physician Assistant

## 2021-09-08 ENCOUNTER — Ambulatory Visit (INDEPENDENT_AMBULATORY_CARE_PROVIDER_SITE_OTHER): Payer: Medicaid Other | Admitting: Family

## 2021-09-08 ENCOUNTER — Other Ambulatory Visit (HOSPITAL_COMMUNITY)
Admission: RE | Admit: 2021-09-08 | Discharge: 2021-09-08 | Disposition: A | Discharge status: Home / Self Care | Payer: Medicaid Other | Source: Ambulatory Visit | Attending: Family | Admitting: Family

## 2021-09-08 ENCOUNTER — Encounter: Payer: Self-pay | Admitting: Family

## 2021-09-08 VITALS — BP 130/77 | HR 65 | Temp 98.4°F | Ht 62.0 in | Wt 156.8 lb

## 2021-09-08 DIAGNOSIS — N898 Other specified noninflammatory disorders of vagina: Secondary | ICD-10-CM

## 2021-09-08 NOTE — Progress Notes (Signed)
   Subjective:     Patient ID: Lori Meyer, female    DOB: [birth year], 31 y.o.   MRN: [id number]  Chief Complaint  Patient presents with   Vaginal Discharge    Pt c/o vaginal odor and discharge. Pt states she has recurrent BV and STD testing.     HPI: Vaginitis: Patient complains of an abnormal vaginal discharge for a month. Vaginal symptoms include: odor, itching, pain STI Risk/HX: low, lesbian Discharge described as: thick, color Other associated symptoms: denies pelvic pain Menstrual pattern: regular Contraception: N/A   Assessment & Plan:   Problem List Items Addressed This Visit   None Visit Diagnoses     Vaginal discharge    -  Primary pt reports seeing GYN a few days ago, per office notes wet prep negative, culture sent out - given clindamycin vaginal cream to use & DERM? referral. has tried multiple antibiotics for bacterial vaginitis and she just keeps getting infections. Pt states she has tried probiotics and boric acid and these have not helped either. Sending swab and urine out today. Pt reports her sister being treated for BV w/Azithromycin and it resolved.    Relevant Orders   Cervicovaginal ancillary only   Urine cytology ancillary only      Outpatient Medications Prior to Visit  Medication Sig Dispense Refill   meclizine (ANTIVERT) 50 MG tablet Take 1 tablet (50 mg total) by mouth 3 (three) times daily as needed. 30 tablet 0   No facility-administered medications prior to visit.    History reviewed. No pertinent past medical history.  History reviewed. No pertinent surgical history.  Allergies  Allergen Reactions   Acetaminophen-Dm     Other reaction(s): Other   Guaifenesin Diarrhea and Other (See Comments)    Other reaction(s): Other (See Comments) abd pain abd pain Unknown    Other     Robitussin       Objective:    Physical Exam Vitals and nursing note reviewed.  Constitutional:      Appearance: Normal appearance.   Cardiovascular:     Rate and Rhythm: Normal rate and regular rhythm.  Pulmonary:     Effort: Pulmonary effort is normal.     Breath sounds: Normal breath sounds.  Musculoskeletal:        General: Normal range of motion.  Skin:    General: Skin is warm and dry.  Neurological:     Mental Status: She is alert.  Psychiatric:        Mood and Affect: Mood normal.        Behavior: Behavior normal.     BP 130/77 (BP Location: Left Arm, Patient Position: Sitting, Cuff Size: Large)   Pulse 65   Temp 98.4 F (36.9 C) (Temporal)   Ht '5\' 2"'$  (1.575 m)   Wt 156 lb 12.8 oz (71.1 kg)   LMP 08/15/2021 (Exact Date)   SpO2 100%   BMI 28.68 kg/m  Wt Readings from Last 3 Encounters:  09/08/21 156 lb 12.8 oz (71.1 kg)  03/07/21 163 lb 3.2 oz (74 kg)  02/10/21 160 lb (72.6 kg)     Jeanie Sewer, NP

## 2021-09-12 ENCOUNTER — Telehealth: Payer: Self-pay | Admitting: Family

## 2021-09-12 LAB — URINE CYTOLOGY ANCILLARY ONLY
Bacterial Vaginitis-Urine: NEGATIVE
Candida Urine: NEGATIVE
Chlamydia: NEGATIVE
Comment: NEGATIVE
Comment: NEGATIVE
Comment: NORMAL
Neisseria Gonorrhea: NEGATIVE
Trichomonas: NEGATIVE

## 2021-09-12 LAB — CERVICOVAGINAL ANCILLARY ONLY
Bacterial Vaginitis (gardnerella): NEGATIVE
Candida Glabrata: NEGATIVE
Candida Vaginitis: NEGATIVE
Chlamydia: NEGATIVE
Comment: NEGATIVE
Comment: NEGATIVE
Comment: NEGATIVE
Comment: NEGATIVE
Comment: NEGATIVE
Comment: NORMAL
Neisseria Gonorrhea: NEGATIVE
Trichomonas: NEGATIVE

## 2021-09-12 NOTE — Telephone Encounter (Signed)
Patient calling in requesting results from a swab and urine test completed at last OV with [facility].   Please follow up in regard.

## 2021-09-12 NOTE — Telephone Encounter (Signed)
I contacted pt to let her know labs are not back yet and she will receive a call or mychart message in regards. Pt gave a verbal understanding.

## 2021-09-13 NOTE — Telephone Encounter (Signed)
Pt called back in. She has seen her results on Mychart is wanting to speak with cma. I informed pt she has not read them yet but will call after they are read.

## 2021-09-13 NOTE — Telephone Encounter (Signed)
Please see pt note and advise 

## 2021-09-13 NOTE — Progress Notes (Signed)
Hi Lori Meyer,  Your urine and vaginal swab are both negative for any STDs, yeast, or BV. You may be hypersensitive to your vaginal odor....you can use OTC vaginal Rephresh gel that restores your ph to a more acidic ph vs. alkaline which leads to BV.  Also there are vaginal cleansing wipes that may help.

## 2021-09-14 ENCOUNTER — Encounter: Payer: Self-pay | Admitting: Family

## 2021-09-14 NOTE — Telephone Encounter (Signed)
See phone note

## 2021-09-15 NOTE — Telephone Encounter (Signed)
Returned pt call and unable to leave vm mailbox full

## 2021-09-15 NOTE — Telephone Encounter (Signed)
Patient would like a call back

## 2021-09-15 NOTE — Telephone Encounter (Signed)
Tried to call pt back and unable to lvm since mailbox was full, will attempt to send MyChart msg to advise to contact office with any questions or concerns regarding recent labs

## 2021-10-10 ENCOUNTER — Ambulatory Visit
Admission: EM | Admit: 2021-10-10 | Discharge: 2021-10-10 | Disposition: A | Discharge status: Home / Self Care | Payer: Medicaid Other | Attending: Emergency Medicine | Admitting: Emergency Medicine

## 2021-10-10 DIAGNOSIS — R102 Pelvic and perineal pain: Secondary | ICD-10-CM | POA: Insufficient documentation

## 2021-10-10 DIAGNOSIS — N898 Other specified noninflammatory disorders of vagina: Secondary | ICD-10-CM | POA: Diagnosis not present

## 2021-10-10 DIAGNOSIS — N760 Acute vaginitis: Secondary | ICD-10-CM

## 2021-10-10 DIAGNOSIS — N72 Inflammatory disease of cervix uteri: Secondary | ICD-10-CM | POA: Insufficient documentation

## 2021-10-10 LAB — POCT URINALYSIS DIP (MANUAL ENTRY)
Bilirubin, UA: NEGATIVE
Blood, UA: NEGATIVE
Glucose, UA: NEGATIVE mg/dL
Ketones, POC UA: NEGATIVE mg/dL
Leukocytes, UA: NEGATIVE
Nitrite, UA: NEGATIVE
Protein Ur, POC: NEGATIVE mg/dL
Spec Grav, UA: 1.025 (ref 1.010–1.025)
Urobilinogen, UA: 1 E.U./dL
pH, UA: 7 (ref 5.0–8.0)

## 2021-10-10 LAB — POCT URINE PREGNANCY: Preg Test, Ur: NEGATIVE

## 2021-10-10 MED ORDER — AZITHROMYCIN 500 MG PO TABS
1000.0000 mg | ORAL_TABLET | Freq: Once | ORAL | Status: AC
  Administered 2021-10-10: 1000 mg via ORAL

## 2021-10-10 MED ORDER — CEFTRIAXONE SODIUM 500 MG IJ SOLR
500.0000 mg | Freq: Once | INTRAMUSCULAR | Status: AC
  Administered 2021-10-10: 500 mg via INTRAMUSCULAR

## 2021-10-10 NOTE — ED Provider Notes (Signed)
[facility]    CSN: [id number] Arrival date & time: 10/10/21  1517    HISTORY   Chief Complaint  Patient presents with   Vaginal Discharge   Vaginal Itching   Abdominal Pain   HPI Lori Meyer is a 31 y.o. female. Patient complains of a 2-week history of lower abdominal discomfort, vaginal odor, vaginal discharge and itching.  Patient was treated with a course of tinidazole for presumed bacterial vaginitis based on patient's complaints which are similar to today's complaints, this was prescribed June 22nd 2023 by a provider at [facility], vaginal swab performed that day was negative.  The history is provided by the patient.   History reviewed. No pertinent past medical history. There are no problems to display for this patient.  History reviewed. No pertinent surgical history. OB History   No obstetric history on file.    Home Medications    Prior to Admission medications   Not on File   Family History Family History  Family history unknown: Yes   Social History Social History   Tobacco Use   Smoking status: Never    Passive exposure: Never   Smokeless tobacco: Never  Vaping Use   Vaping Use: Never used  Substance Use Topics   Alcohol use: Yes    Comment: occasionally   Drug use: Not Currently    Types: Marijuana   Allergies   Acetaminophen-dm, Guaifenesin, and Other  Review of Systems Review of Systems Pertinent findings noted in history of present illness.   Physical Exam Triage Vital Signs ED Triage Vitals  Enc Vitals Group     BP 01/28/21 0827 (!) 147/82     Pulse Rate 01/28/21 0827 72     Resp 01/28/21 0827 18     Temp 01/28/21 0827 98.3 F (36.8 C)     Temp Source 01/28/21 0827 Oral     SpO2 01/28/21 0827 98 %     Weight --      Height --      Head Circumference --      Peak Flow --      Pain Score 01/28/21 0826 5     Pain Loc --      Pain Edu? --      Excl. in [place]? --   No data found.  Updated Vital Signs BP (!)  146/99 (BP Location: Right Arm)   Pulse 90   Temp 98.5 F (36.9 C) (Oral)   Resp 16   LMP 09/15/2021 (Approximate)   SpO2 99%   Physical Exam  Visual Acuity Right Eye Distance:   Left Eye Distance:   Bilateral Distance:    Right Eye Near:   Left Eye Near:    Bilateral Near:     UC Couse / Diagnostics / Procedures:    EKG  Radiology No results found.  Procedures Procedures (including critical care time)  UC Diagnoses / Final Clinical Impressions(s)   I have reviewed the triage vital signs and the nursing notes.  Pertinent labs & imaging results that were available during my care of the patient were reviewed by me and considered in my medical decision making (see chart for details).    Final diagnoses:  Foul smelling vaginal discharge  Acute vaginitis   {LMSTDP:27058}  {LMUTIP:27060}  ED Prescriptions   None    PDMP not reviewed this encounter.  Pending results:  Labs Reviewed  POCT URINALYSIS DIP (MANUAL ENTRY)  POCT URINE PREGNANCY  CERVICOVAGINAL ANCILLARY ONLY  Medications Ordered in UC: Medications - No data to display  Disposition Upon Discharge:  Condition: stable for discharge home  Patient presented with concern for an acute illness with associated systemic symptoms and significant discomfort requiring urgent management. In my opinion, this is a condition that a prudent lay person (someone who possesses an average knowledge of health and medicine) may potentially expect to result in complications if not addressed urgently such as respiratory distress, impairment of bodily function or dysfunction of bodily organs.   As such, the patient has been evaluated and assessed, work-up was performed and treatment was provided in alignment with urgent care protocols and evidence based medicine.  Patient/parent/caregiver has been advised that the patient may require follow up for further testing and/or treatment if the symptoms continue in spite of  treatment, as clinically indicated and appropriate.  Routine symptom specific, illness specific and/or disease specific instructions were discussed with the patient and/or caregiver at length.  Prevention strategies for avoiding STD exposure were also discussed.  The patient will follow up with their current PCP if and as advised. If the patient does not currently have a PCP we will assist them in obtaining one.   The patient may need specialty follow up if the symptoms continue, in spite of conservative treatment and management, for further workup, evaluation, consultation and treatment as clinically indicated and appropriate.  Patient/parent/caregiver verbalized understanding and agreement of plan as discussed.  All questions were addressed during visit.  Please see discharge instructions below for further details of plan.  Discharge Instructions: Discharge Instructions   None     This office note has been dictated using Dragon speech recognition software.  Unfortunately, and despite my best efforts, this method of dictation can sometimes lead to occasional typographical or grammatical errors.  I apologize in advance if this occurs.

## 2021-10-10 NOTE — ED Triage Notes (Signed)
Pt c/o lower abd discomfort, vaginal odor, vaginal discharge and itching.  Started: 2 weeks   Home interventions: none

## 2021-10-10 NOTE — Discharge Instructions (Addendum)
Based on the symptoms and concerns you shared with me today, you were treated for presumed cervicitis with an injection of ceftriaxone 500 mg and 1 g of azithromycin during your visit today.  This is the only treatment you will need for cervicitis.  Please abstain from sexual intercourse of any kind, vaginal, oral or anal, for 7 days.   The results of your vaginal swab test which tests for BV, yeast, gonorrhea, chlamydia and trichomonas will be posted to your MyChart account in the next 3 to 5 days.  If any of your results are abnormal, you will receive a phone call regarding further treatment.  Additional prescriptions, if any are needed, will be provided for you at your pharmacy.     If you have not had improvement of your symptoms after this treatment and/or any further treatments, please consider reaching out to Dr. Dorien Chihuahua with [place] [facility] of gynecology so that you can have a [place] test performed to evaluate for presence of Ureaplasma and mycoplasma.   Please remember that your body is a [place] and you are the [place].  The only way to prevent transmission of sexually transmitted disease when having sexual intercourse is to use condoms.  Repeat sexually transmitted infections can cause scarring in your fallopian tubes which will interfere with your ability to have children.  Repeat exposures to sexually transmitted diseases can also increase your risk of contracting HPV, the human papilloma virus which causes cervical cancer and genital warts and HIV.   Thank you for visiting urgent care today.  I appreciate the opportunity to participate in your care.

## 2021-10-11 LAB — CERVICOVAGINAL ANCILLARY ONLY
Bacterial Vaginitis (gardnerella): NEGATIVE
Candida Glabrata: NEGATIVE
Candida Vaginitis: NEGATIVE
Chlamydia: NEGATIVE
Comment: NEGATIVE
Comment: NEGATIVE
Comment: NEGATIVE
Comment: NEGATIVE
Comment: NEGATIVE
Comment: NORMAL
Neisseria Gonorrhea: NEGATIVE
Trichomonas: NEGATIVE

## 2021-12-26 ENCOUNTER — Encounter: Payer: Self-pay | Admitting: *Deleted

## 2022-03-16 ENCOUNTER — Encounter: Payer: Self-pay | Admitting: *Deleted

## 2022-04-20 ENCOUNTER — Ambulatory Visit: Payer: Medicaid Other | Admitting: Physician Assistant

## 2022-04-20 ENCOUNTER — Ambulatory Visit (INDEPENDENT_AMBULATORY_CARE_PROVIDER_SITE_OTHER): Payer: Medicaid Other | Admitting: Family Medicine

## 2022-04-20 ENCOUNTER — Encounter: Payer: Self-pay | Admitting: Family Medicine

## 2022-04-20 VITALS — BP 114/60 | HR 71 | Temp 98.1°F | Ht 62.0 in | Wt 148.0 lb

## 2022-04-20 DIAGNOSIS — R42 Dizziness and giddiness: Secondary | ICD-10-CM

## 2022-04-20 DIAGNOSIS — R5383 Other fatigue: Secondary | ICD-10-CM | POA: Diagnosis not present

## 2022-04-20 DIAGNOSIS — R002 Palpitations: Secondary | ICD-10-CM | POA: Diagnosis not present

## 2022-04-20 LAB — CBC
HCT: 37.8 % (ref 36.0–46.0)
Hemoglobin: 12.7 g/dL (ref 12.0–15.0)
MCHC: 33.6 g/dL (ref 30.0–36.0)
MCV: 88 fl (ref 78.0–100.0)
Platelets: 354 10*3/uL (ref 150.0–400.0)
RBC: 4.3 Mil/uL (ref 3.87–5.11)
RDW: 12.8 % (ref 11.5–15.5)
WBC: 3.4 10*3/uL — ABNORMAL LOW (ref 4.0–10.5)

## 2022-04-20 LAB — COMPREHENSIVE METABOLIC PANEL
ALT: 10 U/L (ref 0–35)
AST: 16 U/L (ref 0–37)
Albumin: 4.4 g/dL (ref 3.5–5.2)
Alkaline Phosphatase: 39 U/L (ref 39–117)
BUN: 14 mg/dL (ref 6–23)
CO2: 28 mEq/L (ref 19–32)
Calcium: 9.7 mg/dL (ref 8.4–10.5)
Chloride: 106 mEq/L (ref 96–112)
Creatinine, Ser: 0.84 mg/dL (ref 0.40–1.20)
GFR: 92.84 mL/min (ref >60.00)
Glucose, Bld: 88 mg/dL (ref 70–99)
Potassium: 4.7 mEq/L (ref 3.5–5.1)
Sodium: 140 mEq/L (ref 135–145)
Total Bilirubin: 0.6 mg/dL (ref 0.2–1.2)
Total Protein: 7.9 g/dL (ref 6.0–8.3)

## 2022-04-20 LAB — TSH: TSH: 1.15 u[IU]/mL (ref 0.35–5.50)

## 2022-04-20 NOTE — Progress Notes (Signed)
Subjective:  Patient ID: Lori Meyer, female    DOB: Aug 17, 1990  Age: 32 y.o. MRN: [id number]  CC:  Chief Complaint  Patient presents with   Fatigue    Pt notes last week or so feels light headed feeling like she could pass out, no energy, no appetite, when she raises her arms above her head the feeling of passing out is recreated     HPI Lori Meyer presents for   Fatigue, lightheadedness.  On and off past week. Lightheaded with standing at times. Noted in shower raising arms washing hair, but has not had lightheadedness specifically with arm elevation otherwise.  No syncope/fall.  No focal weakness, no headache.  Nosebleeds on occasion - notes with cleaning nose - once.  Not persistent.  No other bleeding, no blood in stool. No fever, cough/congestion/dyspnea. No abd pain.  Hx of irregular menses, No current contraception, sexually active with female partner partner only.  Last pregnancy 2013.  No urinary symptoms.  LMP 04/12/2021, slightly less bleeding than usual but has had a regular menses.   Noticed possible heart fluttering sensation when she was lightheaded, no chest pain or dyspnea. No abdominal pain, nausea or vomiting. 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  Minimal water, eating meals but sometimes difficult when driving.  Red bull 1 to 2/day.  History There are no problems to display for this patient.  No past medical history on file. No past surgical history on file. Allergies  Allergen Reactions   Acetaminophen-Dm     Other reaction(s): Other   Guaifenesin Diarrhea and Other (See Comments)    Other reaction(s): Other (See Comments) abd pain abd pain Unknown    Other     Robitussin   Prior to Admission medications   Not on File   Social History   Socioeconomic History   Marital status: Single    Spouse name: Not on file   Number of children: Not on file   Years of education: Not on file   Highest education level: Not on file   Occupational History   Not on file  Tobacco Use   Smoking status: Never    Passive exposure: Never   Smokeless tobacco: Never  Vaping Use   Vaping Use: Never used  Substance and Sexual Activity   Alcohol use: Yes    Comment: occasionally   Drug use: Not Currently    Types: Marijuana   Sexual activity: Yes    Birth control/protection: None  Other Topics Concern   Not on file  Social History Narrative   Not on file   Social Determinants of Health   Financial Resource Strain: Not on file  Food Insecurity: Not on file  Transportation Needs: Not on file  Physical Activity: Not on file  Stress: Not on file  Social Connections: Not on file  Intimate Partner Violence: Not on file    Review of Systems Per HPI  Objective:   Vitals:   04/20/22 0901  BP: 114/60  Pulse: 71  Temp: 98.1 F (36.7 C)  TempSrc: Oral  SpO2: 98%  Weight: 148 lb (67.1 kg)  Height: '5\' 2"'$  (1.575 m)     Physical Exam Vitals reviewed.  Constitutional:      General: She is not in acute distress.    Appearance: Normal appearance. She is well-developed. She is not ill-appearing, toxic-appearing or diaphoretic.  HENT:     Head: Normocephalic and atraumatic.     Nose:  Comments: Dry mucosa inside the right nare but no active bleeding. Eyes:     Conjunctiva/sclera: Conjunctivae normal.     Pupils: Pupils are equal, round, and reactive to light.  Neck:     Vascular: No carotid bruit.  Cardiovascular:     Rate and Rhythm: Normal rate and regular rhythm.     Heart sounds: Normal heart sounds.  Pulmonary:     Effort: Pulmonary effort is normal.     Breath sounds: Normal breath sounds.  Abdominal:     General: There is no distension.     Palpations: Abdomen is soft. There is no pulsatile mass.     Tenderness: There is no abdominal tenderness. There is no guarding or rebound.  Musculoskeletal:     Right lower leg: No edema.     Left lower leg: No edema.  Skin:    General: Skin is warm and  dry.  Neurological:     General: No focal deficit present.     Mental Status: She is alert and oriented to person, place, and time.     Sensory: No sensory deficit.     Motor: No weakness.     Coordination: Coordination normal.     Gait: Gait normal.  Psychiatric:        Mood and Affect: Mood normal.        Behavior: Behavior normal.      Orthostatic VS for the past 24 hrs (Last 3 readings):  BP- Lying Pulse- Lying BP- Sitting Pulse- Sitting BP- Standing at 0 minutes Pulse- Standing at 0 minutes BP- Standing at 3 minutes Pulse- Standing at 3 minutes  04/20/22 0914 114/62 74 108/58 76 110/60 93 112/60 80  Slight dizzy with standing.   EKG, sinus rhythm, rate 66.  No apparent acute ST or T wave changes.  No prior EKG available for comparison.  Assessment & Plan:  [name] is a 32 y.o. female . Episodic lightheadedness  Other fatigue - Plan: Comprehensive metabolic panel, CBC, EKG 39-JQBH, TSH  Palpitations - Plan: CBC, EKG 12-Lead, TSH  Overall reassuring exam, nonfocal neuro exam.  I suspect relative volume depletion with decreased fluid intake during the day and caffeine as above as primary cause of symptoms.  Could also contribute to some palpitations.  Asymptomatic at present.  Increase fluid intake, minimize caffeine, regular meals discussed, with snacks in between as needed when driving.  Check CBC, CMP, TSH, but anticipate these will be normal.  ER/RTC precautions given if symptoms not improving with treatment above or new/worsening symptoms.  Saline nasal spray, humidifier in the room where she sleeps to help lessen dry nasal passages.   No orders of the defined types were placed in this encounter.  Patient Instructions  Try increasing fluid intake, minimize red bull/caffeine as that can cause heart palpitations and dehydration.  Saline nasal spray and humidifier in your room can help with nosebleeds.  If those continue follow-up with myself or your primary care  provider.  Additionally with the fatigue/lightheadedness if that is not improving and labs look okay, I do recommend following up with either myself or your primary provider as well.  Hope you feel better soon.  Return to the clinic or go to the nearest emergency room if any of your symptoms worsen or new symptoms occur.  Fatigue If you have fatigue, you feel tired all the time and have a lack of energy or a lack of motivation. Fatigue may make it difficult to start or complete  tasks because of exhaustion. Occasional or mild fatigue is often a normal response to activity or life. However, long-term (chronic) or extreme fatigue may be a symptom of a medical condition such as: Depression. Not having enough red blood cells or hemoglobin in the blood (anemia). A problem with a small gland located in the lower front part of the neck (thyroid disorder). Rheumatologic conditions. These are problems related to the body's defense system (immune system). Infections, especially certain viral infections. Fatigue can also lead to negative health outcomes over time. Follow these instructions at home: Medicines Take over-the-counter and prescription medicines only as told by your health care provider. Take a multivitamin if told by your health care provider. Do not use herbal or dietary supplements unless they are approved by your health care provider. Eating and drinking  Avoid heavy meals in the evening. Eat a well-balanced diet, which includes lean proteins, whole grains, plenty of fruits and vegetables, and low-fat dairy products. Avoid eating or drinking too many products with caffeine in them. Avoid alcohol. Drink enough fluid to keep your urine pale yellow. Activity  Exercise regularly, as told by your health care provider. Use or practice techniques to help you relax, such as yoga, tai chi, meditation, or massage therapy. Lifestyle Change situations that cause you stress. Try to keep your work  and personal schedules in balance. Do not use recreational or illegal drugs. General instructions Monitor your fatigue for any changes. Go to bed and get up at the same time every day. Avoid fatigue by pacing yourself during the day and getting enough sleep at night. Maintain a healthy weight. Contact a health care provider if: Your fatigue does not get better. You have a fever. You suddenly lose or gain weight. You have headaches. You have trouble falling asleep or sleeping through the night. You feel angry, guilty, anxious, or sad. You have swelling in your legs or another part of your body. Get help right away if: You feel confused, feel like you might faint, or faint. Your vision is blurry or you have a severe headache. You have severe pain in your abdomen, your back, or the area between your waist and hips (pelvis). You have chest pain, shortness of breath, or an irregular or fast heartbeat. You are unable to urinate, or you urinate less than normal. You have abnormal bleeding from the rectum, nose, lungs, nipples, or, if you are female, the vagina. You vomit blood. You have thoughts about hurting yourself or others. These symptoms may be an emergency. Get help right away. Call 911. Do not wait to see if the symptoms will go away. Do not drive yourself to the hospital. Get help right away if you feel like you may hurt yourself or others, or have thoughts about taking your own life. Go to your nearest emergency room or: Call 911. Call the [facility] at ([phone] or 988. This is open 24 hours a day. Text the Crisis Text Line at (431) 828-0308. Summary If you have fatigue, you feel tired all the time and have a lack of energy or a lack of motivation. Fatigue may make it difficult to start or complete tasks because of exhaustion. Long-term (chronic) or extreme fatigue may be a symptom of a medical condition. Exercise regularly, as told by your health care  provider. Change situations that cause you stress. Try to keep your work and personal schedules in balance. This information is not intended to replace advice given to you by your health  care provider. Make sure you discuss any questions you have with your health care provider. Document Revised: 01/10/2021 Document Reviewed: 01/10/2021 Elsevier Patient Education  [place].  Dizziness Dizziness is a common problem. It is a feeling of unsteadiness or light-headedness. You may feel like you are about to faint. Dizziness can lead to injury if you stumble or fall. Anyone can become dizzy, but dizziness is more common in older adults. This condition can be caused by a number of things, including medicines, dehydration, or illness. Follow these instructions at home: Eating and drinking  Drink enough fluid to keep your urine pale yellow. This helps to keep you from becoming dehydrated. Try to drink more clear fluids, such as water. Do not drink alcohol. Limit your caffeine intake if told to do so by your health care provider. Check ingredients and nutrition facts to see if a food or beverage contains caffeine. Limit your salt (sodium) intake if told to do so by your health care provider. Check ingredients and nutrition facts to see if a food or beverage contains sodium. Activity  Avoid making quick movements. Rise slowly from chairs and steady yourself until you feel okay. In the morning, first sit up on the side of the bed. When you feel okay, stand slowly while you hold onto something until you know that your balance is good. If you need to stand in one place for a long time, move your legs often. Tighten and relax the muscles in your legs while you are standing. Do not drive or use machinery if you feel dizzy. Avoid bending down if you feel dizzy. Place items in your home so that they are easy for you to reach without leaning over. Lifestyle Do not use any products that contain nicotine  or tobacco. These products include cigarettes, chewing tobacco, and vaping devices, such as e-cigarettes. If you need help quitting, ask your health care provider. Try to reduce your stress level by using methods such as yoga or meditation. Talk with your health care provider if you need help to manage your stress. General instructions Watch your dizziness for any changes. Take over-the-counter and prescription medicines only as told by your health care provider. Talk with your health care provider if you think that your dizziness is caused by a medicine that you are taking. Tell a friend or a family member that you are feeling dizzy. If he or she notices any changes in your behavior, have this person call your health care provider. Keep all follow-up visits. This is important. Contact a health care provider if: Your dizziness does not go away or you have new symptoms. Your dizziness or light-headedness gets worse. You feel nauseous. You have reduced hearing. You have a fever. You have neck pain or a stiff neck. Your dizziness leads to an injury or a fall. Get help right away if: You vomit or have diarrhea and are unable to eat or drink anything. You have problems talking, walking, swallowing, or using your arms, hands, or legs. You feel generally weak. You have any bleeding. You are not thinking clearly or you have trouble forming sentences. It may take a friend or family member to notice this. You have chest pain, abdominal pain, shortness of breath, or sweating. Your vision changes or you develop a severe headache. These symptoms may represent a serious problem that is an emergency. Do not wait to see if the symptoms will go away. Get medical help right away. Call your local emergency services (  911 in the [place] Do not drive yourself to the hospital. Summary Dizziness is a feeling of unsteadiness or light-headedness. This condition can be caused by a number of things, including medicines,  dehydration, or illness. Anyone can become dizzy, but dizziness is more common in older adults. Drink enough fluid to keep your urine pale yellow. Do not drink alcohol. Avoid making quick movements if you feel dizzy. Monitor your dizziness for any changes. This information is not intended to replace advice given to you by your health care provider. Make sure you discuss any questions you have with your health care provider. Document Revised: 02/23/2020 Document Reviewed: 02/23/2020 Elsevier Patient Education  2023 [place],   Merri Ray, MD [place] 04/20/22 11:09 AM

## 2022-04-20 NOTE — Patient Instructions (Signed)
Try increasing fluid intake, minimize red bull/caffeine as that can cause heart palpitations and dehydration.  Saline nasal spray and humidifier in your room can help with nosebleeds.  If those continue follow-up with myself or your primary care provider.  Additionally with the fatigue/lightheadedness if that is not improving and labs look okay, I do recommend following up with either myself or your primary provider as well.  Hope you feel better soon.  Return to the clinic or go to the nearest emergency room if any of your symptoms worsen or new symptoms occur.  Fatigue If you have fatigue, you feel tired all the time and have a lack of energy or a lack of motivation. Fatigue may make it difficult to start or complete tasks because of exhaustion. Occasional or mild fatigue is often a normal response to activity or life. However, long-term (chronic) or extreme fatigue may be a symptom of a medical condition such as: Depression. Not having enough red blood cells or hemoglobin in the blood (anemia). A problem with a small gland located in the lower front part of the neck (thyroid disorder). Rheumatologic conditions. These are problems related to the body's defense system (immune system). Infections, especially certain viral infections. Fatigue can also lead to negative health outcomes over time. Follow these instructions at home: Medicines Take over-the-counter and prescription medicines only as told by your health care provider. Take a multivitamin if told by your health care provider. Do not use herbal or dietary supplements unless they are approved by your health care provider. Eating and drinking  Avoid heavy meals in the evening. Eat a well-balanced diet, which includes lean proteins, whole grains, plenty of fruits and vegetables, and low-fat dairy products. Avoid eating or drinking too many products with caffeine in them. Avoid alcohol. Drink enough fluid to keep your urine pale  yellow. Activity  Exercise regularly, as told by your health care provider. Use or practice techniques to help you relax, such as yoga, tai chi, meditation, or massage therapy. Lifestyle Change situations that cause you stress. Try to keep your work and personal schedules in balance. Do not use recreational or illegal drugs. General instructions Monitor your fatigue for any changes. Go to bed and get up at the same time every day. Avoid fatigue by pacing yourself during the day and getting enough sleep at night. Maintain a healthy weight. Contact a health care provider if: Your fatigue does not get better. You have a fever. You suddenly lose or gain weight. You have headaches. You have trouble falling asleep or sleeping through the night. You feel angry, guilty, anxious, or sad. You have swelling in your legs or another part of your body. Get help right away if: You feel confused, feel like you might faint, or faint. Your vision is blurry or you have a severe headache. You have severe pain in your abdomen, your back, or the area between your waist and hips (pelvis). You have chest pain, shortness of breath, or an irregular or fast heartbeat. You are unable to urinate, or you urinate less than normal. You have abnormal bleeding from the rectum, nose, lungs, nipples, or, if you are female, the vagina. You vomit blood. You have thoughts about hurting yourself or others. These symptoms may be an emergency. Get help right away. Call 911. Do not wait to see if the symptoms will go away. Do not drive yourself to the hospital. Get help right away if you feel like you may hurt yourself or others, or  have thoughts about taking your own life. Go to your nearest emergency room or: Call 911. Call the [facility] at [phone] or 988. This is open 24 hours a day. Text the Crisis Text Line at 2061883817. Summary If you have fatigue, you feel tired all the time and have  a lack of energy or a lack of motivation. Fatigue may make it difficult to start or complete tasks because of exhaustion. Long-term (chronic) or extreme fatigue may be a symptom of a medical condition. Exercise regularly, as told by your health care provider. Change situations that cause you stress. Try to keep your work and personal schedules in balance. This information is not intended to replace advice given to you by your health care provider. Make sure you discuss any questions you have with your health care provider. Document Revised: 01/10/2021 Document Reviewed: 01/10/2021 Elsevier Patient Education  [place].  Dizziness Dizziness is a common problem. It is a feeling of unsteadiness or light-headedness. You may feel like you are about to faint. Dizziness can lead to injury if you stumble or fall. Anyone can become dizzy, but dizziness is more common in older adults. This condition can be caused by a number of things, including medicines, dehydration, or illness. Follow these instructions at home: Eating and drinking  Drink enough fluid to keep your urine pale yellow. This helps to keep you from becoming dehydrated. Try to drink more clear fluids, such as water. Do not drink alcohol. Limit your caffeine intake if told to do so by your health care provider. Check ingredients and nutrition facts to see if a food or beverage contains caffeine. Limit your salt (sodium) intake if told to do so by your health care provider. Check ingredients and nutrition facts to see if a food or beverage contains sodium. Activity  Avoid making quick movements. Rise slowly from chairs and steady yourself until you feel okay. In the morning, first sit up on the side of the bed. When you feel okay, stand slowly while you hold onto something until you know that your balance is good. If you need to stand in one place for a long time, move your legs often. Tighten and relax the muscles in your legs  while you are standing. Do not drive or use machinery if you feel dizzy. Avoid bending down if you feel dizzy. Place items in your home so that they are easy for you to reach without leaning over. Lifestyle Do not use any products that contain nicotine or tobacco. These products include cigarettes, chewing tobacco, and vaping devices, such as e-cigarettes. If you need help quitting, ask your health care provider. Try to reduce your stress level by using methods such as yoga or meditation. Talk with your health care provider if you need help to manage your stress. General instructions Watch your dizziness for any changes. Take over-the-counter and prescription medicines only as told by your health care provider. Talk with your health care provider if you think that your dizziness is caused by a medicine that you are taking. Tell a friend or a family member that you are feeling dizzy. If he or she notices any changes in your behavior, have this person call your health care provider. Keep all follow-up visits. This is important. Contact a health care provider if: Your dizziness does not go away or you have new symptoms. Your dizziness or light-headedness gets worse. You feel nauseous. You have reduced hearing. You have a fever. You have  neck pain or a stiff neck. Your dizziness leads to an injury or a fall. Get help right away if: You vomit or have diarrhea and are unable to eat or drink anything. You have problems talking, walking, swallowing, or using your arms, hands, or legs. You feel generally weak. You have any bleeding. You are not thinking clearly or you have trouble forming sentences. It may take a friend or family member to notice this. You have chest pain, abdominal pain, shortness of breath, or sweating. Your vision changes or you develop a severe headache. These symptoms may represent a serious problem that is an emergency. Do not wait to see if the symptoms will go away. Get  medical help right away. Call your local emergency services (911 in the [place] Do not drive yourself to the hospital. Summary Dizziness is a feeling of unsteadiness or light-headedness. This condition can be caused by a number of things, including medicines, dehydration, or illness. Anyone can become dizzy, but dizziness is more common in older adults. Drink enough fluid to keep your urine pale yellow. Do not drink alcohol. Avoid making quick movements if you feel dizzy. Monitor your dizziness for any changes. This information is not intended to replace advice given to you by your health care provider. Make sure you discuss any questions you have with your health care provider. Document Revised: 02/23/2020 Document Reviewed: 02/23/2020 Elsevier Patient Education  [place].

## 2022-04-21 ENCOUNTER — Ambulatory Visit: Payer: Medicaid Other | Admitting: Physician Assistant

## 2022-07-27 ENCOUNTER — Ambulatory Visit: Payer: Medicaid Other | Admitting: Physician Assistant

## 2022-12-07 ENCOUNTER — Other Ambulatory Visit: Payer: Self-pay

## 2022-12-07 DIAGNOSIS — R040 Epistaxis: Secondary | ICD-10-CM | POA: Diagnosis present

## 2022-12-07 NOTE — ED Triage Notes (Addendum)
Pt states she has been getting nose bleeds once a week, pt denies headache, denies blood thinner use, denies hx HTN.

## 2022-12-08 ENCOUNTER — Emergency Department
Admission: EM | Admit: 2022-12-08 | Discharge: 2022-12-08 | Disposition: A | Discharge status: Home / Self Care | Payer: Medicaid Other | Attending: Emergency Medicine | Admitting: Emergency Medicine

## 2022-12-08 DIAGNOSIS — R040 Epistaxis: Secondary | ICD-10-CM

## 2022-12-08 LAB — CBC
HCT: 38.9 % (ref 36.0–46.0)
Hemoglobin: 12.8 g/dL (ref 12.0–15.0)
MCH: 29.2 pg (ref 26.0–34.0)
MCHC: 32.9 g/dL (ref 30.0–36.0)
MCV: 88.8 fL (ref 80.0–100.0)
Platelets: 277 10*3/uL (ref 150–400)
RBC: 4.38 MIL/uL (ref 3.87–5.11)
RDW: 12.3 % (ref 11.5–15.5)
WBC: 5.2 10*3/uL (ref 4.0–10.5)
nRBC: 0 % (ref 0.0–0.2)

## 2022-12-08 LAB — PROTIME-INR
INR: 0.9 (ref 0.8–1.2)
Prothrombin Time: 12.7 s (ref 11.4–15.2)

## 2022-12-08 NOTE — ED Provider Notes (Signed)
[facility] Provider Note    Event Date/Time   First MD Initiated Contact with Patient 12/08/22 0153     (approximate)   History   Epistaxis   HPI  Lori Meyer is a 32 y.o. female who presents to the ED from home with a chief complaint of nosebleeds.  Patient reports nosebleeds approximately weekly since January of this year.  Reports bleeding is always from her right nostril, and usually does not last more than 5 minutes at a time.  Denies bleeding disorders or family history of bleeding disorders.  Presents tonight because she would like medical evaluation as she has not yet had one since the nosebleeds began in January, 9 months ago.  Denies associated fever/chills, headache, vision changes, anticoagulant use, chest pain, shortness of breath, abdominal pain, nausea, vomiting, dizziness, rectal bleeding, bleeding while brushing teeth, mysterious bruising.     Past Medical History  History reviewed. No pertinent past medical history.   Active Problem List  There are no problems to display for this patient.    Past Surgical History  History reviewed. No pertinent surgical history.   Home Medications   Prior to Admission medications   Not on File     Allergies  Acetaminophen-dm, Guaifenesin, and Other   Family History   Family History  Family history unknown: Yes     Physical Exam  Triage Vital Signs: ED Triage Vitals  Encounter Vitals Group     BP 12/07/22 2248 121/69     Systolic BP Percentile --      Diastolic BP Percentile --      Pulse Rate 12/07/22 2248 92     Resp 12/07/22 2248 18     Temp 12/07/22 2248 98.2 F (36.8 C)     Temp Source 12/07/22 2248 Oral     SpO2 12/07/22 2248 100 %     Weight 12/07/22 2247 145 lb (65.8 kg)     Height 12/07/22 2247 5\' 2"  (1.575 m)     Head Circumference --      Peak Flow --      Pain Score 12/07/22 2247 0     Pain Loc --      Pain Education --      Exclude from Growth Chart --      Updated Vital Signs: BP 121/69   Pulse 92   Temp 98.2 F (36.8 C) (Oral)   Resp 18   Ht 5\' 2"  (1.575 m)   Wt 65.8 kg   SpO2 100%   BMI 26.52 kg/m    General: Awake, no distress.  CV:  RRR.  Good peripheral perfusion.  Resp:  Normal effort.  CTAB. Abd:  No distention.  Other:  No bleeding from right naris.   ED Results / Procedures / Treatments  Labs (all labs ordered are listed, but only abnormal results are displayed) Labs Reviewed  CBC  PROTIME-INR     EKG  None   RADIOLOGY None   Official radiology report(s): No results found.   PROCEDURES:  Critical Care performed: No  Procedures   MEDICATIONS ORDERED IN ED: Medications - No data to display   IMPRESSION / MDM / ASSESSMENT AND PLAN / ED COURSE  I reviewed the triage vital signs and the nursing notes.                             32 year old female who presents with weekly nosebleeds  x 9 months.  Hemoglobin and coags unremarkable.  Will refer to hematology as well as ENT for outpatient follow-up.  Strict return precautions given.  Patient verbalizes understanding and agrees with plan of care.  Patient's presentation is most consistent with acute, uncomplicated illness.   FINAL CLINICAL IMPRESSION(S) / ED DIAGNOSES   Final diagnoses:  Right-sided epistaxis     Rx / DC Orders   ED Discharge Orders     None        Note:  This document was prepared using Dragon voice recognition software and may include unintentional dictation errors.   Irean Hong, MD 12/08/22 [phone]

## 2022-12-08 NOTE — Discharge Instructions (Signed)
Apply clamp to nose in the event of further nosebleed.  Return to the ER for worsening symptoms, feeling lightheaded or dizzy, difficulty breathing or other concerns.

## 2022-12-11 NOTE — Group Note (Deleted)

## 2023-06-03 ENCOUNTER — Emergency Department
Admission: EM | Admit: 2023-06-03 | Discharge: 2023-06-03 | Disposition: A | Discharge status: Home / Self Care | Attending: Emergency Medicine | Admitting: Emergency Medicine

## 2023-06-03 ENCOUNTER — Other Ambulatory Visit: Payer: Self-pay

## 2023-06-03 DIAGNOSIS — J101 Influenza due to other identified influenza virus with other respiratory manifestations: Secondary | ICD-10-CM | POA: Diagnosis not present

## 2023-06-03 DIAGNOSIS — R6883 Chills (without fever): Secondary | ICD-10-CM | POA: Diagnosis present

## 2023-06-03 LAB — RESP PANEL BY RT-PCR (RSV, FLU A&B, COVID)  RVPGX2
Influenza A by PCR: POSITIVE — AB
Influenza B by PCR: NEGATIVE
Resp Syncytial Virus by PCR: NEGATIVE
SARS Coronavirus 2 by RT PCR: NEGATIVE

## 2023-06-03 MED ORDER — ONDANSETRON 4 MG PO TBDP: 4.0000 mg | ORAL_TABLET | Freq: Three times a day (TID) | ORAL | 0 refills | Status: AC | PRN

## 2023-06-03 NOTE — ED Triage Notes (Signed)
 Pt sts that she has been getting cold chills as well as running fever with body aches for the last three days.

## 2023-06-03 NOTE — ED Provider Notes (Signed)
   [facility] Provider Note    Event Date/Time   First MD Initiated Contact with Patient 06/03/23 1515     (approximate)   History   Chills   HPI  Lori Meyer is a 33 y.o. female who presents with complaints of chills, cough, body aches, fatigue for approximately 3 days.  Also has some mild nausea and lightheadedness.     Physical Exam   Triage Vital Signs: ED Triage Vitals [06/03/23 1356]  Encounter Vitals Group     BP 115/83     Systolic BP Percentile      Diastolic BP Percentile      Pulse Rate (!) 110     Resp 17     Temp 98.7 F (37.1 C)     Temp Source Oral     SpO2 96 %     Weight 65.8 kg (145 lb)     Height 1.575 m (5\' 2" )     Head Circumference      Peak Flow      Pain Score 6     Pain Loc      Pain Education      Exclude from Growth Chart     Most recent vital signs: Vitals:   06/03/23 1356  BP: 115/83  Pulse: (!) 110  Resp: 17  Temp: 98.7 F (37.1 C)  SpO2: 96%     General: Awake, no distress.  CV:  Good peripheral perfusion.  Regular rate and rhythm Resp:  Normal effort.  Clear to auscultation bilaterally Abd:  No distention.  Other:     ED Results / Procedures / Treatments   Labs (all labs ordered are listed, but only abnormal results are displayed) Labs Reviewed  RESP PANEL BY RT-PCR (RSV, FLU A&B, COVID)  RVPGX2 - Abnormal; Notable for the following components:      Result Value   Influenza A by PCR POSITIVE (*)    All other components within normal limits     EKG     RADIOLOGY     PROCEDURES:  Critical Care performed:   Procedures   MEDICATIONS ORDERED IN ED: Medications - No data to display   IMPRESSION / MDM / ASSESSMENT AND PLAN / ED COURSE  I reviewed the triage vital signs and the nursing notes. Patient's presentation is most consistent with acute complicated illness / injury requiring diagnostic workup.  Patient presents with symptoms consistent with viral syndrome,  likely influenza given prevalence in the community at this time, PCR test pending.  Overall exam is reassuring and she is in no acute distress.  PCR test is positive for influenza A, recommend supportive care, work note provided, outpatient follow-up, return precautions discussed.        FINAL CLINICAL IMPRESSION(S) / ED DIAGNOSES   Final diagnoses:  Influenza A     Rx / DC Orders   ED Discharge Orders          Ordered    ondansetron (ZOFRAN-ODT) 4 MG disintegrating tablet  Every 8 hours PRN        06/03/23 1520             Note:  This document was prepared using Dragon voice recognition software and may include unintentional dictation errors.   Jene Every, MD 06/03/23 [phone]

## 2023-07-23 ENCOUNTER — Encounter: Payer: Self-pay | Admitting: Family

## 2023-07-23 ENCOUNTER — Ambulatory Visit (INDEPENDENT_AMBULATORY_CARE_PROVIDER_SITE_OTHER): Admitting: Family

## 2023-07-23 VITALS — BP 122/78 | HR 70 | Temp 97.9°F | Ht 62.0 in | Wt 143.6 lb

## 2023-07-23 DIAGNOSIS — Z13220 Encounter for screening for lipoid disorders: Secondary | ICD-10-CM

## 2023-07-23 DIAGNOSIS — R160 Hepatomegaly, not elsewhere classified: Secondary | ICD-10-CM | POA: Insufficient documentation

## 2023-07-23 DIAGNOSIS — N921 Excessive and frequent menstruation with irregular cycle: Secondary | ICD-10-CM | POA: Diagnosis not present

## 2023-07-23 DIAGNOSIS — Z0000 Encounter for general adult medical examination without abnormal findings: Secondary | ICD-10-CM

## 2023-07-23 DIAGNOSIS — Z114 Encounter for screening for human immunodeficiency virus [HIV]: Secondary | ICD-10-CM | POA: Diagnosis not present

## 2023-07-23 DIAGNOSIS — Z862 Personal history of diseases of the blood and blood-forming organs and certain disorders involving the immune mechanism: Secondary | ICD-10-CM

## 2023-07-23 DIAGNOSIS — Z1159 Encounter for screening for other viral diseases: Secondary | ICD-10-CM | POA: Diagnosis not present

## 2023-07-23 LAB — CBC WITH DIFFERENTIAL/PLATELET
Basophils Absolute: 0 10*3/uL (ref 0.0–0.1)
Basophils Relative: 1.2 % (ref 0.0–3.0)
Eosinophils Absolute: 0 10*3/uL (ref 0.0–0.7)
Eosinophils Relative: 0.6 % (ref 0.0–5.0)
HCT: 37.6 % (ref 36.0–46.0)
Hemoglobin: 12.5 g/dL (ref 12.0–15.0)
Lymphocytes Relative: 45.1 % (ref 12.0–46.0)
Lymphs Abs: 1.5 10*3/uL (ref 0.7–4.0)
MCHC: 33.3 g/dL (ref 30.0–36.0)
MCV: 89.9 fl (ref 78.0–100.0)
Monocytes Absolute: 0.4 10*3/uL (ref 0.1–1.0)
Monocytes Relative: 13 % — ABNORMAL HIGH (ref 3.0–12.0)
Neutro Abs: 1.3 10*3/uL — ABNORMAL LOW (ref 1.4–7.7)
Neutrophils Relative %: 40.1 % — ABNORMAL LOW (ref 43.0–77.0)
Platelets: 317 10*3/uL (ref 150.0–400.0)
RBC: 4.18 Mil/uL (ref 3.87–5.11)
RDW: 12.4 % (ref 11.5–15.5)
WBC: 3.2 10*3/uL — ABNORMAL LOW (ref 4.0–10.5)

## 2023-07-23 LAB — COMPREHENSIVE METABOLIC PANEL WITH GFR
ALT: 11 U/L (ref 0–35)
AST: 16 U/L (ref 0–37)
Albumin: 4.4 g/dL (ref 3.5–5.2)
Alkaline Phosphatase: 37 U/L — ABNORMAL LOW (ref 39–117)
BUN: 9 mg/dL (ref 6–23)
CO2: 27 meq/L (ref 19–32)
Calcium: 9.2 mg/dL (ref 8.4–10.5)
Chloride: 104 meq/L (ref 96–112)
Creatinine, Ser: 0.75 mg/dL (ref 0.40–1.20)
GFR: 105.43 mL/min (ref >60.00)
Glucose, Bld: 79 mg/dL (ref 70–99)
Potassium: 3.9 meq/L (ref 3.5–5.1)
Sodium: 138 meq/L (ref 135–145)
Total Bilirubin: 0.9 mg/dL (ref 0.2–1.2)
Total Protein: 7.5 g/dL (ref 6.0–8.3)

## 2023-07-23 LAB — TSH: TSH: 1.83 u[IU]/mL (ref 0.35–5.50)

## 2023-07-23 LAB — LIPID PANEL
Cholesterol: 164 mg/dL (ref 0–200)
HDL: 58.8 mg/dL (ref >39.00)
LDL Cholesterol: 96 mg/dL (ref 0–99)
NonHDL: 104.85
Total CHOL/HDL Ratio: 3
Triglycerides: 44 mg/dL (ref 0.0–149.0)
VLDL: 8.8 mg/dL (ref 0.0–40.0)

## 2023-07-23 NOTE — Assessment & Plan Note (Signed)
 4x5.5 cm enhancing mass in the left lobe of the liver, initially discovered incidentally. Previous MRI showed no growth, 06/2022. Referred to [facility]'s liver clinic for further evaluation and monitoring. Emphasized importance of continued monitoring with a liver specialist. - Request last year's notes from [facility] liver clinic. - Will review records and get back to patient on POC.

## 2023-07-23 NOTE — Progress Notes (Signed)
 Phone [phone]  Subjective:   Patient is a 33 y.o. female presenting for annual physical.    Chief Complaint  Patient presents with   Annual Exam    Fasting w/ labs  Discussed the use of AI scribe software for clinical note transcription with the patient, who gave verbal consent to proceed.  History of Present Illness The patient, with a known history of a liver mass, presents for a routine physical examination. She expresses a desire to have an x-ray to monitor the status of the liver mass, which was previously identified as non-growing. The patient reports occasional aching on the side of the liver mass.  In addition, the patient reports heavy and irregular menstrual cycles, sometimes skipping a month and then experiencing heavier bleeding. She also reports symptoms of fatigue and headaches, raising concerns about possible iron deficiency. The patient denies any cravings for ice, a common symptom in iron deficiency anemia.  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 She is unsure of her previous immunization records as they were done out of state. The patient denies any recent sexually transmitted disease testing and reports being in a monogamous relationship.   See problem oriented charting- ROS- full  review of systems was completed and negative except for what is  noted in HPI above.  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  Patient also has a small benign 1 cm hepatic hemangioma.     I reviewed  with the patient and her mother that these are benign liver lesions however if the larger liver lesion is an adenoma there is potential that this can grow, particularly    Trichomonal vulvovaginitis 08/20/2012   Urinary tract infection, site not specified 06/04/2012   Vaginitis and vulvovaginitis 06/06/2012   ICD10 Updates     There are no active problems to display for this patient.  No past surgical history on file.  Family History  Family history unknown: Yes    Medications- reviewed and updated Current Outpatient Medications  Medication Sig Dispense Refill   ondansetron  (ZOFRAN -ODT) 4 MG disintegrating tablet Take 1 tablet (4 mg total) by mouth every 8 (eight) hours as needed for nausea or vomiting. (Patient not taking: Reported on 07/23/2023) 20 tablet 0   No current facility-administered medications for this visit.    Allergies-reviewed and updated Allergies  Allergen Reactions   Acetaminophen-Dm     Other reaction(s): Other   Guaifenesin Diarrhea and Other (See Comments)    Other reaction(s): Other (See Comments) abd pain abd pain Unknown    Other     Robitussin    Social History   Social History Narrative   Not on file   Objective:  BP 122/78 (BP Location: Left Arm, Patient Position: Sitting, Cuff Size: Large)   Pulse 70   Temp 97.9 F (36.6 C) (Temporal)   Ht 5\' 2"  (1.575 m)   Wt 143 lb 9.6 oz (65.1 kg)   LMP 07/11/2023 (Approximate)   SpO2 100%   BMI 26.26 kg/m  Physical Exam Vitals and nursing note reviewed.  Constitutional:  Appearance: Normal appearance.  HENT:     Head: Normocephalic.     Right Ear: Tympanic membrane normal.     Left Ear: Tympanic membrane normal.     Nose: Nose normal.     Mouth/Throat:     Mouth: Mucous membranes are moist.  Eyes:     Pupils: Pupils are equal, round, and reactive to light.  Cardiovascular:     Rate and Rhythm: Normal rate and regular rhythm.  Pulmonary:     Effort: Pulmonary effort is normal.      Breath sounds: Normal breath sounds.  Musculoskeletal:        General: Normal range of motion.     Cervical back: Normal range of motion.  Lymphadenopathy:     Cervical: No cervical adenopathy.  Skin:    General: Skin is warm and dry.  Neurological:     Mental Status: She is alert.  Psychiatric:        Mood and Affect: Mood normal.        Behavior: Behavior normal.     Assessment and Plan   Health Maintenance counseling: 1. Anticipatory guidance: Patient counseled regarding regular dental exams q6 months, eye exams,  avoiding smoking and second hand smoke, limiting alcohol to 1 beverage per day, no illicit drugs.   2. Risk factor reduction:  Advised patient of need for regular exercise and diet rich with fruits and vegetables to reduce risk of heart attack and stroke. Wt Readings from Last 3 Encounters:  07/23/23 143 lb 9.6 oz (65.1 kg)  06/03/23 145 lb (65.8 kg)  12/07/22 145 lb (65.8 kg)   3. Immunizations/screenings/ancillary studies  There is no immunization history on file for this patient. Health Maintenance Due  Topic Date Due   HIV Screening  Never done    4. Cervical cancer screening: utd, but unsure which clinic 5. Skin cancer screening- advised regular sunscreen use. Denies worrisome, changing, or new skin lesions.  6. Birth control/STD check: none, STD testing not needed 7. Smoking associated screening: never- smoker 8. Alcohol screening: rarely  Heavy menstrual bleeding Experiences heavy menstrual bleeding with irregular cycles and clots. Not using hormonal contraception. Potential link to iron deficiency anemia discussed. - Monitor menstrual cycle and bleeding patterns, schedule appt with GYN for ongoing concerns. - Consider over-the-counter iron supplementation during menstrual cycle.  Iron deficiency anemia (suspected, hx of) Suspected due to fatigue, dizziness, and heavy menstrual bleeding. Discussed symptoms of low iron such as fatigue, headaches, and  chest tightness. - Order complete blood count and iron studies. - Consider over-the-counter iron supplementation based on lab results.  Liver mass - 4x5.5 cm enhancing mass in the left lobe of the liver, initially discovered incidentally. Previous MRI showed no growth, 06/2022. Referred to [facility]'s liver clinic for further evaluation and monitoring. Emphasized importance of continued monitoring with a liver specialist. - Request last year's notes from [facility] liver clinic. - Will review records and get back to patient on POC.  Wellness Visit Routine wellness visit for general health maintenance and college vaccines requirements. Discussed need for immunizations and blood work for school enrollment. - Order CPE blood work for general health assessment. - Advise office if records of past vaccines found - Administer necessary immunizations or obtain titers as needed for school enrollment and complete forms.   Recommended follow up:  Return for any future concerns, Complete physical w/fasting labs. No future appointments.  Lab/Order associations: fasting   Versa Gore, NP

## 2023-07-23 NOTE — Patient Instructions (Addendum)
 It was very nice to see you today!   I will review your lab results via MyChart in a few days.  Try to find your vaccine records from childhood and get a copy to us . If unable to find, we can either do titers or have to give the vaccines again. Just let us  know!      PLEASE NOTE:  If you had any lab tests please let us  know if you have not heard back within a few days. You may see your results on MyChart before we have a chance to review them but we will give you a call once they are reviewed by us . If we ordered any referrals today, please let us  know if you have not heard from their office within the next week.

## 2023-07-23 NOTE — Assessment & Plan Note (Signed)
 Suspected due to fatigue, dizziness, and heavy menstrual bleeding. Discussed symptoms of low iron such as fatigue, headaches, and chest tightness. - Order complete blood count and iron studies. - Consider over-the-counter iron supplementation based on lab results.

## 2023-07-24 LAB — HIV ANTIBODY (ROUTINE TESTING W REFLEX): HIV 1&2 Ab, 4th Generation: NONREACTIVE

## 2023-07-24 LAB — HEPATITIS C ANTIBODY: Hepatitis C Ab: NONREACTIVE

## 2023-07-29 ENCOUNTER — Encounter: Payer: Self-pay | Admitting: Family

## 2023-08-09 ENCOUNTER — Encounter: Payer: Self-pay | Admitting: Physician Assistant

## 2023-08-13 ENCOUNTER — Ambulatory Visit: Admitting: Physician Assistant

## 2023-08-14 ENCOUNTER — Ambulatory Visit: Admitting: Physician Assistant

## 2024-01-02 IMAGING — MR MR ABDOMEN WO/W CM
12 of 17 series · 27 of 48 positions shown · IV contrast (14ml Multihance)
Comparison: MRI abdomen 03/30/2021

CLINICAL DATA: Follow-up liver mass

EXAM:
MRI ABDOMEN WITHOUT AND WITH CONTRAST
TECHNIQUE: Multiplanar multisequence MR imaging of the abdomen was performed
both before and after the administration of intravenous contrast.
CONTRAST:  14mL MULTIHANCE GADOBENATE DIMEGLUMINE 529 MG/ML IV SOLN

[Series 3: T2 fat-sat · axial · 6.0mm · 1.09mm/px · 1 of 32 slices shown]
[im 1/32]
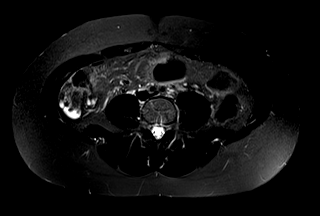

[Series 4: cor haste · coronal · 5.0mm · 0.68mm/px · 1 of 32 slices shown]
[im 1/32]
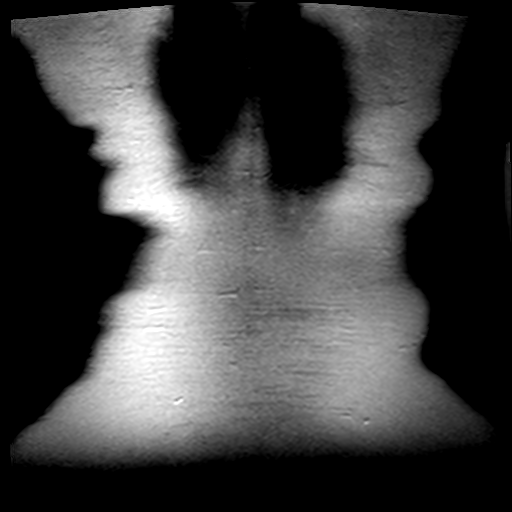

[Series 5: axial haste · axial · 6.0mm · 0.68mm/px · 1 of 31 slices shown]
[im 1/31]
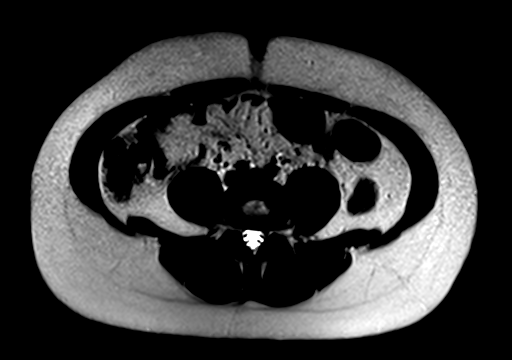

[Series 6: ep2d_diff_b50_500_800_p2_trig · axial · 6.0mm · 1.82mm/px · z∈[-131,+92]mm · 3 of 96 slices shown]
[im 1/96]
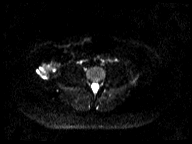
[im 48/96]
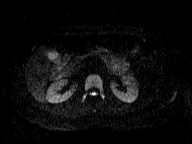
[im 96/96]
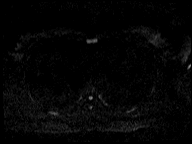

[Series 7: ep2d_[id number]_500_[id number]_trig_adc · axial · 6.0mm · 1.82mm/px · 1 of 32 slices shown]
[im 1/32]
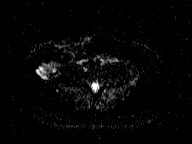

[Series 8: T1 · axial · 6.0mm · 0.68mm/px · z∈[-158,+54]mm · 2 of 66 slices shown]
[im 1/66]
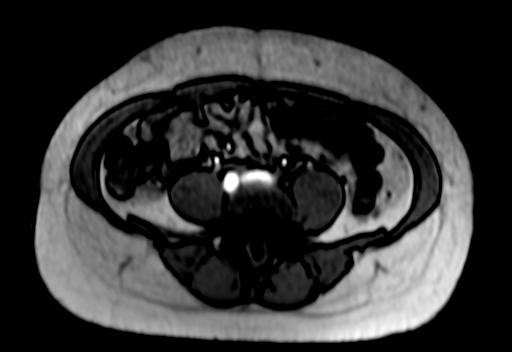
[im 66/66]
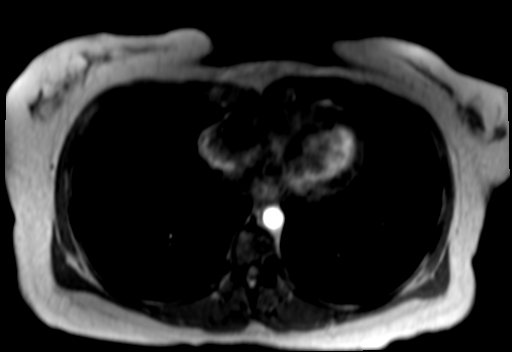

[Series 9: bSSFP · axial · 4.0mm · 0.68mm/px · 1 of 53 slices shown]
[im 1/53]
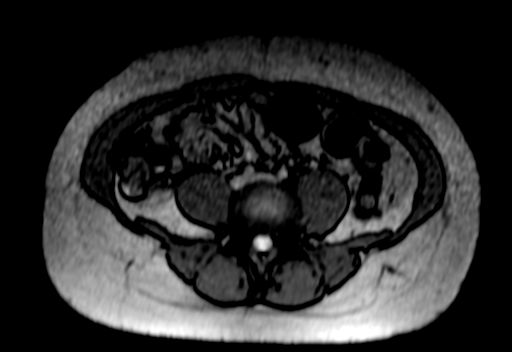

[Series 10: T1 dynamic · axial · non-contrast · 2.5mm · 0.74mm/px · z∈[-160,+58]mm · 3 of 88 slices shown]
[im 1/88]
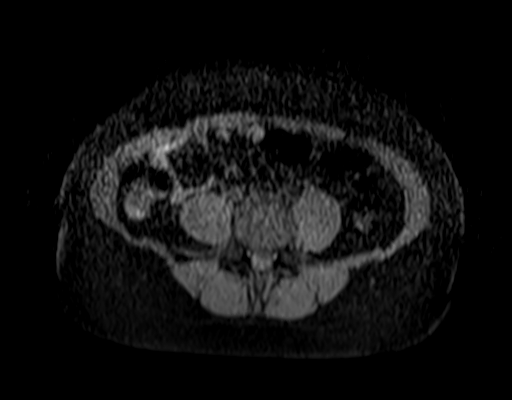
[im 44/88]
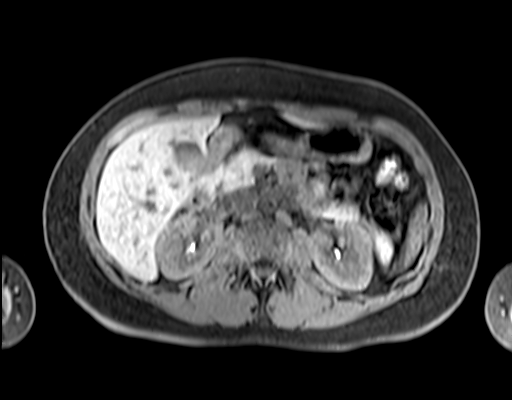
[im 88/88]
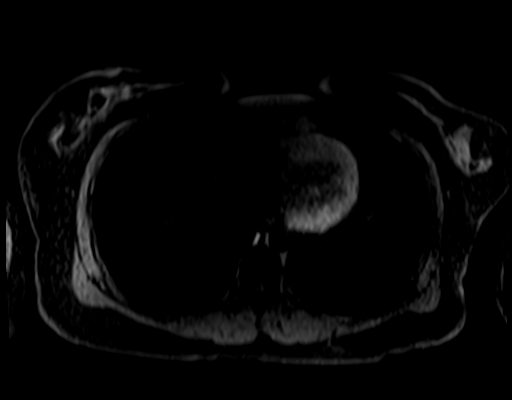

[Series 11: T1 dynamic post-contrast · axial · 2.5mm · 0.74mm/px · z∈[-160,+58]mm · 4 of 88 slices shown (1 of 4)]
[im 1/88]
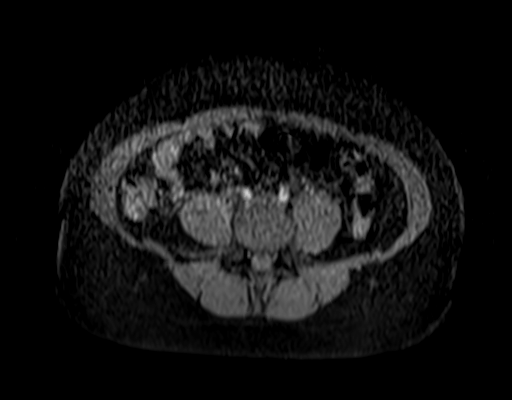
[im 30/88]
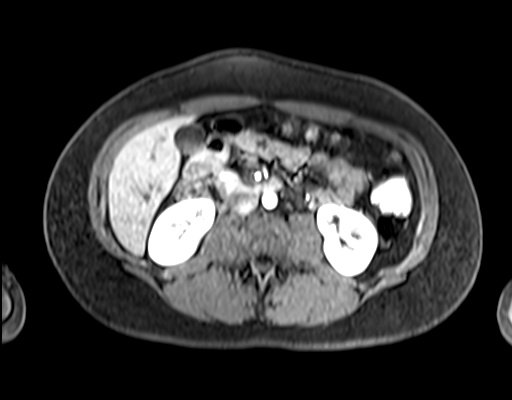
[im 59/88]
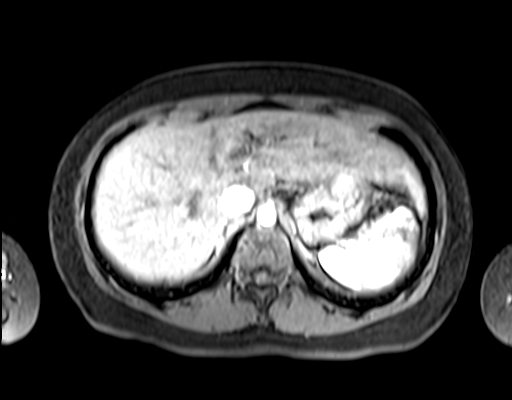
[im 88/88]
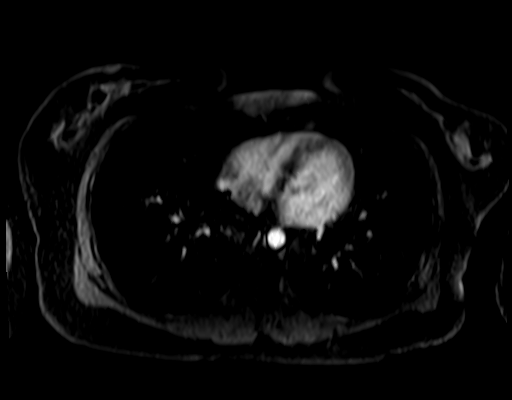

[Series 12: T1 dynamic post-contrast · axial · 2.5mm · 0.74mm/px · z∈[-160,+58]mm · 4 of 88 slices shown (2 of 4)]
[im 1/88]
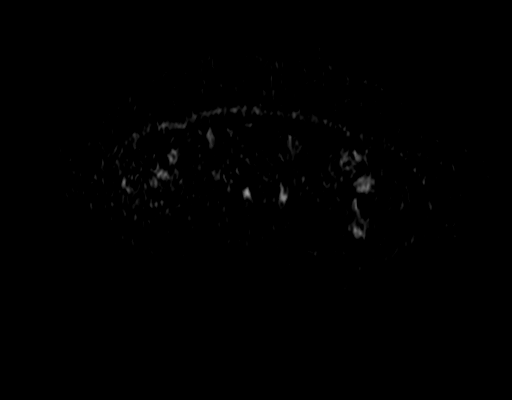
[im 30/88]
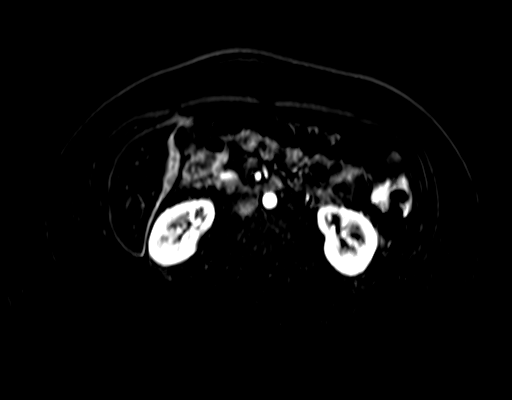
[im 59/88]
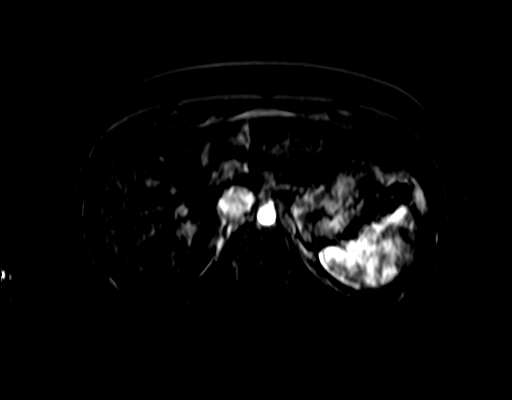
[im 88/88]
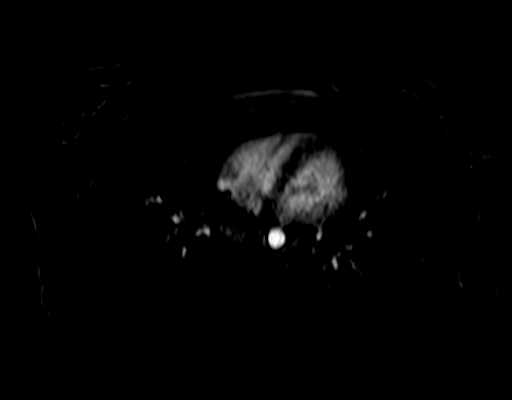

[Series 13: T1 dynamic post-contrast · axial · 2.5mm · 0.74mm/px · z∈[-160,+58]mm · 4 of 88 slices shown (3 of 4)]
[im 1/88]
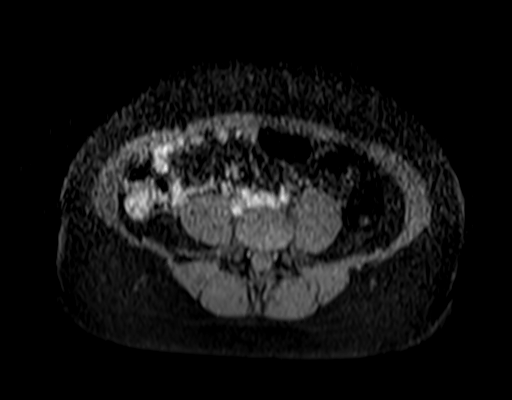
[im 30/88]
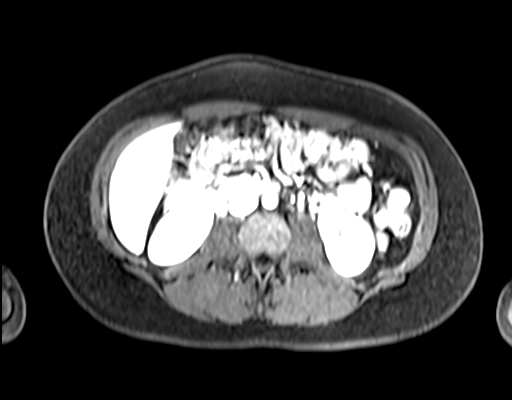
[im 59/88]
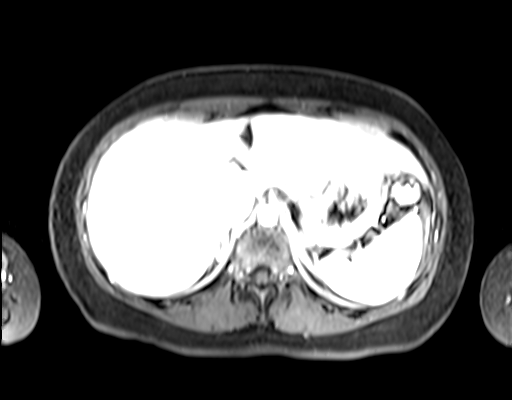
[im 88/88]
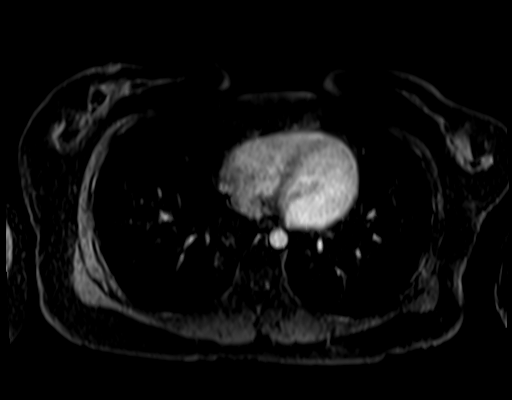

[Series 14: T1 dynamic post-contrast · axial · 2.5mm · 0.74mm/px · z∈[-160,-87]mm · 2 of 88 slices shown (4 of 4)]
[im 1/88]
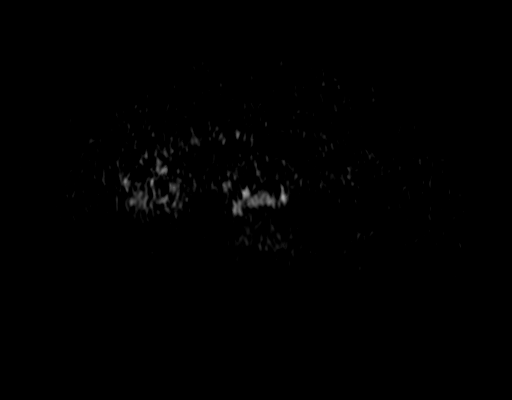
[im 30/88]
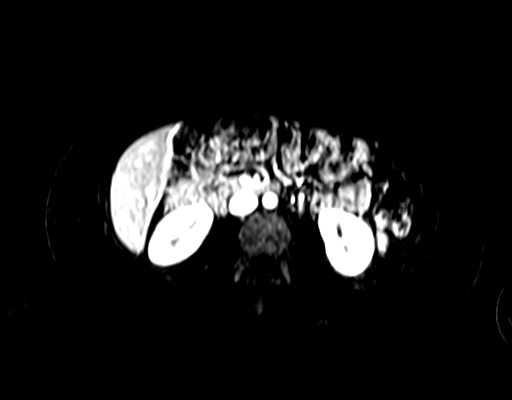

[27 of 48 positions shown; findings below may reference images not displayed]

FINDINGS: Study is somewhat limited due to motion.

Lower chest: No acute findings.

Hepatobiliary: Liver is normal in size and contour. Similar size and
appearance of a partially exophytic subcapsular lobulated mass in
the left hepatic lobe segment 2 which measures 4.3 x 5.6 x 3.1 cm in
AP, transverse and craniocaudal dimensions. The mass again
demonstrates early arterial enhancement which persists on all phases
including mild relative enhancement on the delayed phase. No
definite evidence of a central scar noted. Redemonstration of an 8
mm focus of early arterial hyperenhancement. Gallbladder appears
within normal limits. No biliary ductal dilatation.

Pancreas: No mass, inflammatory changes, or other parenchymal
abnormality identified.

Spleen:  Within normal limits in size and appearance.

Adrenals/Urinary Tract: No masses identified. No evidence of
hydronephrosis.

Stomach/Bowel: Visualized portions within the abdomen are
unremarkable.

Vascular/Lymphatic: No pathologically enlarged lymph nodes
identified. No abdominal aortic aneurysm demonstrated.

Other:  No ascites

Musculoskeletal: No suspicious bone lesions identified.
IMPRESSION: 1. No significant change in size or appearance of the solid
enhancing mass in the left hepatic lobe as described. Eovist
contrast was not used and the differential remains hepatic adenoma
versus focal nodular hyperplasia. If Eovist contrast is unavailable
for possible imaging confirmation, consider surgical consultation
for possible excision given that the lesion may be an adenoma which
has a predilection for hemorrhage especially subcapsular/exophytic
location.
2. Stable subcentimeter focal likely flash hemangioma or vascular
anomaly in the right hepatic lobe.

## 2024-01-09 LAB — RESULTS CONSOLE HPV: CHL HPV: NEGATIVE

## 2024-01-09 LAB — OB RESULTS CONSOLE GC/CHLAMYDIA: Chlamydia: NEGATIVE

## 2024-01-09 LAB — HM PAP SMEAR: HM Pap smear: NEGATIVE

## 2024-02-08 NOTE — Progress Notes (Signed)
 Obstetrics & Gynecology Office Visit     HPI:  Lori Meyer is a 33 y.o. female who presents for an established patient office visit for evaluation of an abnormal vaginal itching, discharge and odor.   She feels that this could be related to a hormonal imbalance since she is not having regular menses. She is unsure when her last menstrual cycle, possibly September. Denies hirsutism and acne.   Symptoms have been ongoing Vaginal symptoms: itching, rash Duration: persistent Characteristics: thick, yellow discharge with musty smell Prior treatments and evaluations: diflucan, metronidazole, doxycycline  Contraception: none Sexual history: denies new sexual partner  No current outpatient medications on file.   No current facility-administered medications for this visit.   Allergies  Allergen Reactions  . Robitussin [Guaifenesin] Other (See Comments)    abd pain    No past medical history on file. No past surgical history on file. OB History  Gravida Para Term Preterm AB Living  2 2 2      SAB IAB Ectopic Molar Multiple Live Births           # Outcome Date GA Lbr Len/2nd Weight Sex Type Anes PTL Lv  2 Term 02/25/12    F Vag-Spont     1 Term 05/02/07    F Vag-Spont      Family History  Problem Relation Name Age of Onset  . Breast cancer Maternal Aunt     Social History   Tobacco Use  . Smoking status: Never  . Smokeless tobacco: Never  Substance Use Topics  . Alcohol use: Yes    Comment: occasional  . Drug use: Not Currently    Comment: marijuana- rarely     Feview of Systems:    ROS: see HPI for pertinent positives and negatives, otherwise a 10 system review is negative.   No SOB, no palpitations or chest pain, no new lower extremity edema, no nausea or vomiting.   Exam:   Vitals:   02/08/24 1628  BP: 116/66   Body mass index is 30.87 kg/m.  Physical Exam Constitutional:      Appearance: Normal appearance.  Genitourinary:     Vulva normal.      Genitourinary Comments: Small erosion of left lower vulva     Right Labia: lesions.      Vaginal discharge present.     Vaginal exam comments: Thick, white discharge.     No cervical motion tenderness, discharge or friability.  Cardiovascular:     Rate and Rhythm: Normal rate and regular rhythm.  Neurological:     Mental Status: She is alert and oriented to person, place, and time.  Skin:    General: Skin is warm and dry.  Psychiatric:        Mood and Affect: Mood normal.        Behavior: Behavior normal.        Thought Content: Thought content normal.        Judgment: Judgment normal.     Chaperone present for pelvic exam. Examination chaperoned by A. Tod, CMA  Assessment/Plan:   1. Chronic vaginitis Genital culture collected for ongoing symptoms with no relief with previous treatment. Negative wet prep.   -     Genital Culture, Routine - [facility] -     Wet Prep  2. Irregular menses Discussed that AUB is common in women, it can include irregularities in cycle length, absence of menses, excessive in duration, menstrual flow and intermenstrual bleeding. We discussed that some cycle variation  can be normal. Based on PALM-COEIN, nonstructural causes are the most common causes of AUB in adolescents, with ovulatory disorders being the most common. Will obtain labs for further evaluation.   -     FSH and LH - [facility] -     Estradiol - [facility] -     Testosterone,Free and Total - [facility] -     DHEA-Sulfate - [facility] -     Prolactin - [facility] -     Sex Horm Binding Glob, Serum - [facility] -     Progesterone - [facility]   Orders Placed This Encounter  Procedures  . Genital Culture, Routine - [facility]    Release to patient:   Immediate  . Wet Prep    Release to patient:   Immediate  . FSH and LH - [facility]    Release to patient:   Immediate  . Estradiol - [facility]    Release to patient:   Immediate  . Testosterone,Free and Total - [facility]    Release to patient:   Immediate  .  DHEA-Sulfate - [facility]    Release to patient:   Immediate  . Prolactin - [facility]    Release to patient:   Immediate  . Sex Horm Binding Glob, Serum - [facility]    Release to patient:   Immediate  . Progesterone - [facility]    Release to patient:   Immediate     Return if symptoms worsen or fail to improve.   Attestation Statement:   I personally performed the service, non-incident to. (WP)   Comer Ellen, FNP [facility]

## 2024-03-06 ENCOUNTER — Emergency Department: Admission: EM | Admit: 2024-03-06 | Discharge: 2024-03-06 | Disposition: A | Discharge status: Home / Self Care

## 2024-03-06 ENCOUNTER — Other Ambulatory Visit: Payer: Self-pay

## 2024-03-06 DIAGNOSIS — J028 Acute pharyngitis due to other specified organisms: Secondary | ICD-10-CM | POA: Insufficient documentation

## 2024-03-06 DIAGNOSIS — J029 Acute pharyngitis, unspecified: Secondary | ICD-10-CM

## 2024-03-06 DIAGNOSIS — B9789 Other viral agents as the cause of diseases classified elsewhere: Secondary | ICD-10-CM | POA: Insufficient documentation

## 2024-03-06 DIAGNOSIS — R059 Cough, unspecified: Secondary | ICD-10-CM | POA: Diagnosis present

## 2024-03-06 LAB — GROUP A STREP BY PCR: Group A Strep by PCR: NOT DETECTED

## 2024-03-06 LAB — RESP PANEL BY RT-PCR (RSV, FLU A&B, COVID)  RVPGX2
Influenza A by PCR: NEGATIVE
Influenza B by PCR: NEGATIVE
Resp Syncytial Virus by PCR: NEGATIVE
SARS Coronavirus 2 by RT PCR: NEGATIVE

## 2024-03-06 NOTE — Discharge Instructions (Signed)
 You were seen today due to concern of a sore throat.  At this time I suspect your symptoms are likely due to a viral infection, you should take Tylenol and ibuprofen at home as well as drink plenty water.  If you have any worsening of symptoms such as severe headache, fevers, chills, or any other symptoms you find concerning please return to the emergency department immediately for further medical management.  You should otherwise follow-up with your primary doctor.

## 2024-03-06 NOTE — ED Provider Notes (Signed)
 [facility] Provider Note    Event Date/Time   First MD Initiated Contact with Patient 03/06/24 [phone]     (approximate)   History   Sore Throat (/)   HPI  Lori Meyer is a 33 y.o. female who presents with concern of cough sore throat headache.  For the last 2 or 3 days has been having persistent symptoms of sore throat hoarse voice, in the mornings will have a headache and feeling very fatigued.  No known sick contacts, denies difficulty swallowing, but this does feel that she has a hoarse voice.  Has not had any fevers that she is noted at home.  No other known medical problems denies chance of pregnancy, has been taking ibuprofen in the morning to help with the headache which seems to improve in response to medications.  No surgical history does not take any other medications.     Physical Exam   Triage Vital Signs: ED Triage Vitals  Encounter Vitals Group     BP 03/06/24 0508 (!) 121/104     Girls Systolic BP Percentile --      Girls Diastolic BP Percentile --      Boys Systolic BP Percentile --      Boys Diastolic BP Percentile --      Pulse Rate 03/06/24 0508 (!) 110     Resp 03/06/24 0508 19     Temp 03/06/24 0508 98.9 F (37.2 C)     Temp src --      SpO2 03/06/24 0508 98 %     Weight 03/06/24 0505 160 lb (72.6 kg)     Height 03/06/24 0505 5' 2 (1.575 m)     Head Circumference --      Peak Flow --      Pain Score 03/06/24 0505 6     Pain Loc --      Pain Education --      Exclude from Growth Chart --     Most recent vital signs: Vitals:   03/06/24 0508  BP: (!) 121/104  Pulse: (!) 110  Resp: 19  Temp: 98.9 F (37.2 C)  SpO2: 98%     General: Awake, no distress.  HEENT: Mild erythema along the bilateral tonsils but no noted exudates, there does appear to be superficial enlarged lymph nodes along the anterior neck CV:  Good peripheral perfusion.  Resp:  Normal effort.  Able to speak in full sentences Abd:  No distention.   Other:     ED Results / Procedures / Treatments   Labs (all labs ordered are listed, but only abnormal results are displayed) Labs Reviewed  RESP PANEL BY RT-PCR (RSV, FLU A&B, COVID)  [id number]  GROUP A STREP BY PCR     EKG     RADIOLOGY   PROCEDURES:  Critical Care performed: No  Procedures   MEDICATIONS ORDERED IN ED: Medications - No data to display   IMPRESSION / MDM / ASSESSMENT AND PLAN / ED COURSE  I reviewed the triage vital signs and the nursing notes.                               Patient's presentation is most consistent with acute, uncomplicated illness.  Well-appearing 33 year old female who presents with concern of 2 days of sore throat and hoarse voice.  She appears well she is not in any acute distress.  Upon arrival slightly tachycardic, but  remaining vitals are reassuring no fevers at home.  I suspect most likely a viral URI or pharyngitis, strep testing obtained prior to my own assessment, but I feel reasonable although unlikely given the clinical presentation.  Also viral panel is pending, we will follow-up the results of this, anticipate likely discharge home.   Clinical Course as of 03/06/24 0655  Thu Mar 06, 2024  [id number] Awaiting swab testing at this time, suspect likely using discharge home afterwards. [SK]    Clinical Course User Index [SK] Fernand Rossie HERO, MD     FINAL CLINICAL IMPRESSION(S) / ED DIAGNOSES   Final diagnoses:  Viral pharyngitis     Rx / DC Orders   ED Discharge Orders     None        Note:  This document was prepared using Dragon voice recognition software and may include unintentional dictation errors.   Fernand Rossie HERO, MD 03/06/24 [phone]

## 2024-03-06 NOTE — ED Triage Notes (Signed)
 Pt reports sore throat cough and congestion for the past 2 days.

## 2024-03-06 NOTE — ED Provider Notes (Signed)
 Care of this patient assumed from prior physician at 0700 pending strep and viral swab, disposition. Please see prior physician note for further details.  Briefly this is a 33 year old female presenting with 2 to 3 days of sore throat.  Swabs pending, anticipated discharge following completion of these with antibiotics as indicated.  7:28 AM Strep and viral swab negative.  Patient reassessed.  No new complaints.  Comfortable with discharge home.  Strict return precautions provided.   Levander Slate, MD 03/06/24 [phone]

## 2024-04-22 ENCOUNTER — Ambulatory Visit: Payer: Self-pay

## 2024-04-22 NOTE — Telephone Encounter (Signed)
"  Noted appt scheduled   "

## 2024-04-22 NOTE — Telephone Encounter (Signed)
 FYI Only or Action Required?: FYI only for provider: appointment scheduled on 1/29.  Patient was last seen in primary care on 07/23/2023 by Lucius Krabbe, NP.  Called Nurse Triage reporting Hand Problem.  Symptoms began 2 years ago.  Interventions attempted: Nothing.  Symptoms are: gradually worsening.  Triage Disposition: See PCP Within 2 Weeks  Patient/caregiver understands and will follow disposition?: Yes    Bilateral hand cramping with decreased grip strength x2 years. Has been gradually getting worse. Cramping when using her hands. Gets up 7.5/10, intermittent. No swelling or fever. No CP. No redness or swelling. Denies weakness, numbness or tingling. Scheduled appt with PCP on 1/29. Advised UC or ED for worsening symptoms.    Message from [place] sent at 04/22/2024 10:07 AM EST  Hand cramping and decrease in grip that has gotten worst. DT refused scheduling as the pt mentioned it has gotten worst. Pt would like to schedule an appointment   Reason for Disposition  Hand pain is a chronic symptom (recurrent or ongoing AND present > 4 weeks)  Answer Assessment - Initial Assessment Questions 1. ONSET: When did the pain start?     2 years ago  2. LOCATION: Where is the pain located?     Both hands  3. PAIN: How bad is the pain? (Scale 1-10; or mild, moderate, severe)     Gets up to 7.5/10 when using her hands  4. WORK OR EXERCISE: Has there been any recent work or exercise that involved this part (i.e., hand or wrist) of the body?     Denies  5. CAUSE: What do you think is causing the pain?     Unsure  6. AGGRAVATING FACTORS: What makes the pain worse? (e.g., using computer)     Using  7. OTHER SYMPTOMS: Do you have any other symptoms? (e.g., fever, neck pain, numbness or tingling, rash, swelling)     Denies  8. PREGNANCY: Is there any chance you are pregnant? When was your last menstrual period?     Denies. LMP 1/2.  Protocols used: Hand  Pain-A-AH

## 2024-05-01 ENCOUNTER — Encounter: Payer: Self-pay | Admitting: Physician Assistant

## 2024-05-01 ENCOUNTER — Ambulatory Visit: Admitting: Physician Assistant

## 2024-05-01 VITALS — BP 118/76 | HR 82 | Temp 97.3°F | Ht 62.0 in | Wt 178.0 lb

## 2024-05-01 DIAGNOSIS — R160 Hepatomegaly, not elsewhere classified: Secondary | ICD-10-CM | POA: Diagnosis not present

## 2024-05-01 DIAGNOSIS — M79642 Pain in left hand: Secondary | ICD-10-CM

## 2024-05-01 DIAGNOSIS — L659 Nonscarring hair loss, unspecified: Secondary | ICD-10-CM

## 2024-05-01 DIAGNOSIS — L299 Pruritus, unspecified: Secondary | ICD-10-CM

## 2024-05-01 DIAGNOSIS — M79641 Pain in right hand: Secondary | ICD-10-CM

## 2024-05-01 DIAGNOSIS — N921 Excessive and frequent menstruation with irregular cycle: Secondary | ICD-10-CM

## 2024-05-01 LAB — IBC + FERRITIN
Ferritin: 27.6 ng/mL (ref 10.0–291.0)
Iron: 88 ug/dL (ref 42–145)
Saturation Ratios: 22 % (ref 20.0–50.0)
TIBC: 400.4 ug/dL (ref 250.0–450.0)
Transferrin: 286 mg/dL (ref 212.0–360.0)

## 2024-05-01 LAB — CBC WITH DIFFERENTIAL/PLATELET
Basophils Absolute: 0 10*3/uL (ref 0.0–0.1)
Basophils Relative: 1 % (ref 0.0–3.0)
Eosinophils Absolute: 0 10*3/uL (ref 0.0–0.7)
Eosinophils Relative: 0.5 % (ref 0.0–5.0)
HCT: 38 % (ref 36.0–46.0)
Hemoglobin: 12.7 g/dL (ref 12.0–15.0)
Lymphocytes Relative: 45.1 % (ref 12.0–46.0)
Lymphs Abs: 1.1 10*3/uL (ref 0.7–4.0)
MCHC: 33.4 g/dL (ref 30.0–36.0)
MCV: 87.8 fl (ref 78.0–100.0)
Monocytes Absolute: 0.2 10*3/uL (ref 0.1–1.0)
Monocytes Relative: 8.1 % (ref 3.0–12.0)
Neutro Abs: 1.1 10*3/uL — ABNORMAL LOW (ref 1.4–7.7)
Neutrophils Relative %: 45.3 % (ref 43.0–77.0)
Platelets: 300 10*3/uL (ref 150.0–400.0)
RBC: 4.33 Mil/uL (ref 3.87–5.11)
RDW: 12.6 % (ref 11.5–15.5)
WBC: 2.5 10*3/uL — ABNORMAL LOW (ref 4.0–10.5)

## 2024-05-01 LAB — COMPREHENSIVE METABOLIC PANEL WITH GFR
ALT: 20 U/L (ref 3–35)
AST: 20 U/L (ref 5–37)
Albumin: 4.5 g/dL (ref 3.5–5.2)
Alkaline Phosphatase: 44 U/L (ref 39–117)
BUN: 12 mg/dL (ref 6–23)
CO2: 28 meq/L (ref 19–32)
Calcium: 9.7 mg/dL (ref 8.4–10.5)
Chloride: 105 meq/L (ref 96–112)
Creatinine, Ser: 0.78 mg/dL (ref 0.40–1.20)
GFR: 100.04 mL/min
Glucose, Bld: 86 mg/dL (ref 70–99)
Potassium: 3.7 meq/L (ref 3.5–5.1)
Sodium: 141 meq/L (ref 135–145)
Total Bilirubin: 0.8 mg/dL (ref 0.2–1.2)
Total Protein: 8 g/dL (ref 6.0–8.3)

## 2024-05-01 LAB — TSH: TSH: 1.67 u[IU]/mL (ref 0.35–5.50)

## 2024-05-01 LAB — VITAMIN B12: Vitamin B-12: 326 pg/mL (ref 211–911)

## 2024-05-01 NOTE — Progress Notes (Signed)
 "   Patient ID: Lori Meyer, female    DOB: 1990/12/09, 34 y.o.   MRN: [id number]   Assessment & Plan:  Bilateral hand pain -     Vitamin B12 -     TSH -     Comprehensive metabolic panel with GFR -     CBC with Differential/Platelet -     IBC + Ferritin -     ANA, IFA Comprehensive Panel -     Rheumatoid factor -     Meloxicam ; Take 1 tablet (15 mg total) by mouth daily. Take with food.  Dispense: 30 tablet; Refill: 0  Hair thinning -     Vitamin B12 -     TSH -     Comprehensive metabolic panel with GFR -     CBC with Differential/Platelet -     IBC + Ferritin -     ANA, IFA Comprehensive Panel -     Rheumatoid factor  Itchy scalp  Liver mass, left lobe -     Comprehensive metabolic panel with GFR  Menorrhagia with irregular cycle -     CBC with Differential/Platelet -     IBC + Ferritin  Other orders -     Ketoconazole ; Apply 1 Application topically 2 (two) times a week.  Dispense: 120 mL; Refill: 2 -     Clobetasol  Propionate; Apply 1 Application topically daily. Use for scalp psoriasis. Consistent use for 2 weeks, then sparingly if flares occur.  Dispense: 50 mL; Refill: 2      Assessment and Plan Assessment & Plan Bilateral hand pain Chronic bilateral hand pain exacerbated by gripping activities, with no numbness or tingling. Differential includes tendinitis, arthritis, or early autoimmune conditions. Less likely carpal tunnel syndrome due to lack of numbness and tingling in the median nerve distribution. - Ordered ANA panel, CBC, CMP, IBC, ferritin, rheumatoid factor, TSH, and B12 to evaluate for autoimmune conditions. - Prescribed NSAIDs for inflammation and pain management. - Advised on reducing salt intake and avoiding overuse of hands. - Will consider referral to hand specialty if symptoms persist.  Scalp dermatitis with hair loss Chronic scalp dermatitis with severe itching, tenderness, and hair thinning. Previous treatment with ketoconazole  shampoo was  ineffective. Possible scalp psoriasis considered due to symptoms and lack of response to antifungal treatment. - Prescribed ketoconazole  shampoo for use twice a week. - Ordered labs to check for underlying causes of hair loss and scalp irritation. - Recommended vitamin supplementation.  Liver mass, left lobe Liver mass identified incidentally during evaluation for back spasm. Previous MRI in March 2023 showed no growth. Follow-up plan unclear. - Contacted hepatologist Dawn Drazek to discuss follow-up plan for liver mass. - Will consider ordering follow-up MRI if recommended by hepatologist.  Menorrhagia with irregular cycle Irregular menstrual cycles with recent concern for possible missed period. No current pregnancy suspected. - Ordered TSH to evaluate thyroid  function as a potential cause of irregular cycles.     F/up for CPE this year    Subjective:    Chief Complaint  Patient presents with   Hand Pain    Pt in office c/o BIL hand cramping and past 2 yrs pt reports decreased strength and grip; pt reports anytime she is writing cooking and using hands causes cramping and getting worse over time    HPI Discussed the use of AI scribe software for clinical note transcription with the patient, who gave verbal consent to proceed.  History of Present Illness Lori Meyer is a  34 year old female who presents with hand cramping and scalp issues.  She experiences hand cramping that has progressively worsened, particularly during activities requiring gripping, such as cooking, writing, and her work as a advertising account planner. The cramping occasionally causes her hands to lock up. No numbness, tingling, or stiffness upon waking. The right hand is more affected than the left.  She reports significant scalp issues, including severe itching, soreness, and a sensation of heat, along with sores and a yeasty odor. Ketoconazole  shampoo was ineffective. The scalp issues began after a hair  appointment in 2022, leading to hair thinning and easy hair loss. No psoriasis elsewhere on her body.  She has a history of a liver mass discovered incidentally during a hospital visit for back spasms. She recalls being informed that the mass was not growing, but she is unsure about the current status due to lack of recent follow-up.  She experiences vertigo, which impacts her ability to work. She also notes irregular menstrual cycles, with a likelihood of missing her period this month, consistent with her history of irregularity.     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  Patient also has a small benign 1 cm hepatic hemangioma.     I reviewed with the patient and her mother that these are benign liver lesions however if the larger liver lesion is an adenoma there is potential that this can grow, particularly    Trichomonal vulvovaginitis 08/20/2012   Urinary tract infection, site not specified 06/04/2012   Vaginitis and vulvovaginitis 06/06/2012   ICD10 Updates      History reviewed. No pertinent surgical history.  Family History  Family history unknown: Yes    Social History[1]   Allergies[2]  Review of Systems NEGATIVE UNLESS OTHERWISE INDICATED IN HPI      Objective:     BP 118/76 (BP Location: Left Arm, Patient Position: Sitting, Cuff Size: Normal)   Pulse 82   Temp (!) 97.3 F (36.3 C) (Temporal)   Ht 5' 2 (1.575 m)   Wt 178 lb (80.7 kg)   LMP 04/04/2024 (Exact Date)   SpO2 99%   BMI 32.56 kg/m   Wt Readings from Last 3 Encounters:  05/01/24 178 lb (80.7 kg)  03/06/24 160 lb (72.6 kg)  07/23/23 143 lb 9.6 oz (65.1 kg)     BP Readings from Last 3 Encounters:  05/01/24 118/76  03/06/24 (!) 121/104  07/23/23 122/78     Physical Exam Vitals and nursing note reviewed.  Constitutional:      General: She is not in acute distress.    Appearance: Normal appearance. She is not ill-appearing.  HENT:     Head: Normocephalic.     Right Ear: External ear normal.     Left Ear: External ear normal.  Eyes:     Extraocular Movements: Extraocular movements intact.     Conjunctiva/sclera: Conjunctivae normal.     Pupils: Pupils are equal, round, and reactive to light.  Cardiovascular:     Rate and Rhythm: Normal rate and regular rhythm.     Pulses: Normal pulses.     Heart sounds: Normal heart sounds. No murmur heard. Pulmonary:  Effort: Pulmonary effort is normal. No respiratory distress.     Breath sounds: Normal breath sounds. No wheezing.  Musculoskeletal:        General: No swelling or tenderness.     Cervical back: Normal range of motion.     Comments: Normal grip strength   Skin:    General: Skin is warm and dry.     Comments: Flaking in scalp noted  Neurological:     Mental Status: She is alert and oriented to person, place, and time.  Psychiatric:        Mood and Affect: Mood normal.        Behavior: Behavior normal.             Mazi Brailsford M Naarah Borgerding, PA-C     [1]  Social History Tobacco Use   Smoking status: Never    Passive exposure: Never   Smokeless tobacco: Never  Vaping Use   Vaping status: Never Used  Substance Use Topics   Alcohol use: Yes    Comment: occasionally   Drug use: Not Currently    Types: Marijuana  [2]  Allergies Allergen Reactions   Acetaminophen-Dm     Other reaction(s): Other   Guaifenesin Diarrhea and Other (See Comments)    Other reaction(s): Other (See Comments) abd pain abd pain Unknown    Other     Robitussin   "

## 2024-05-02 LAB — RHEUMATOID FACTOR: Rheumatoid fact SerPl-aCnc: <10 [IU]/mL

## 2024-05-02 MED ORDER — MELOXICAM 15 MG PO TABS: 15.0000 mg | ORAL_TABLET | Freq: Every day | ORAL | 0 refills | Status: AC

## 2024-05-02 MED ORDER — KETOCONAZOLE 2 % EX SHAM: 1.0000 | MEDICATED_SHAMPOO | CUTANEOUS | 2 refills | Status: AC

## 2024-05-02 MED ORDER — CLOBETASOL PROPIONATE 0.05 % EX SOLN: 1.0000 | Freq: Every day | CUTANEOUS | 2 refills | Status: AC

## 2024-05-03 LAB — ANA, IFA COMPREHENSIVE PANEL
Anti Nuclear Antibody (ANA): NEGATIVE
ENA SM Ab Ser-aCnc: <1 AI
SM/RNP: <1 AI
SSA (Ro) (ENA) Antibody, IgG: <1 AI
SSB (La) (ENA) Antibody, IgG: <1 AI
Scleroderma (Scl-70) (ENA) Antibody, IgG: <1 AI
ds DNA Ab: 1 [IU]/mL

## 2024-05-06 ENCOUNTER — Other Ambulatory Visit: Payer: Self-pay | Admitting: Physician Assistant

## 2024-05-06 DIAGNOSIS — R160 Hepatomegaly, not elsewhere classified: Secondary | ICD-10-CM

## 2024-05-07 ENCOUNTER — Ambulatory Visit: Payer: Self-pay | Admitting: Physician Assistant

## 2024-05-07 ENCOUNTER — Other Ambulatory Visit: Payer: Self-pay

## 2024-05-07 DIAGNOSIS — D729 Disorder of white blood cells, unspecified: Secondary | ICD-10-CM

## 2024-05-28 ENCOUNTER — Other Ambulatory Visit
